# Patient Record
Sex: Female | Born: 1965 | Race: White | Hispanic: No | Smoking: Never smoker
Health system: Southern US, Community
[De-identification: ages and names within clinical notes are randomized; demographics above are authoritative.]

## PROBLEM LIST (undated history)

## (undated) DIAGNOSIS — B999 Unspecified infectious disease: Secondary | ICD-10-CM

## (undated) DIAGNOSIS — D649 Anemia, unspecified: Secondary | ICD-10-CM

## (undated) DIAGNOSIS — K219 Gastro-esophageal reflux disease without esophagitis: Secondary | ICD-10-CM

## (undated) DIAGNOSIS — R51 Headache: Secondary | ICD-10-CM

## (undated) HISTORY — PX: OTHER SURGICAL HISTORY: SHX169

---

## 1999-09-06 ENCOUNTER — Other Ambulatory Visit: Admission: RE | Admit: 1999-09-06 | Discharge: 1999-09-06 | Payer: Self-pay | Admitting: Obstetrics & Gynecology

## 1999-11-27 ENCOUNTER — Inpatient Hospital Stay (HOSPITAL_COMMUNITY): Admission: AD | Admit: 1999-11-27 | Discharge: 1999-11-27 | Payer: Self-pay | Admitting: Obstetrics and Gynecology

## 1999-11-27 ENCOUNTER — Encounter: Payer: Self-pay | Admitting: Obstetrics and Gynecology

## 2000-02-17 ENCOUNTER — Encounter: Admission: RE | Admit: 2000-02-17 | Discharge: 2000-02-17 | Payer: Self-pay | Admitting: Obstetrics & Gynecology

## 2000-02-17 ENCOUNTER — Encounter: Payer: Self-pay | Admitting: Obstetrics & Gynecology

## 2000-03-16 ENCOUNTER — Inpatient Hospital Stay (HOSPITAL_COMMUNITY): Admission: AD | Admit: 2000-03-16 | Discharge: 2000-03-20 | Payer: Self-pay | Admitting: Obstetrics and Gynecology

## 2000-03-16 ENCOUNTER — Encounter: Payer: Self-pay | Admitting: Obstetrics and Gynecology

## 2000-03-21 ENCOUNTER — Encounter: Admission: RE | Admit: 2000-03-21 | Discharge: 2000-04-20 | Payer: Self-pay | Admitting: Obstetrics & Gynecology

## 2000-05-21 ENCOUNTER — Encounter: Admission: RE | Admit: 2000-05-21 | Discharge: 2000-06-20 | Payer: Self-pay | Admitting: Obstetrics & Gynecology

## 2000-06-21 ENCOUNTER — Encounter: Admission: RE | Admit: 2000-06-21 | Discharge: 2000-07-21 | Payer: Self-pay | Admitting: Obstetrics & Gynecology

## 2000-08-21 ENCOUNTER — Encounter: Admission: RE | Admit: 2000-08-21 | Discharge: 2000-09-20 | Payer: Self-pay | Admitting: Obstetrics & Gynecology

## 2000-09-21 ENCOUNTER — Encounter: Admission: RE | Admit: 2000-09-21 | Discharge: 2000-10-21 | Payer: Self-pay | Admitting: Obstetrics & Gynecology

## 2000-11-21 ENCOUNTER — Encounter: Admission: RE | Admit: 2000-11-21 | Discharge: 2000-12-21 | Payer: Self-pay | Admitting: Obstetrics & Gynecology

## 2001-01-16 HISTORY — PX: TOE SURGERY: SHX1073

## 2001-01-21 ENCOUNTER — Encounter: Admission: RE | Admit: 2001-01-21 | Discharge: 2001-02-20 | Payer: Self-pay | Admitting: Obstetrics & Gynecology

## 2001-02-21 ENCOUNTER — Encounter: Admission: RE | Admit: 2001-02-21 | Discharge: 2001-03-23 | Payer: Self-pay | Admitting: Obstetrics & Gynecology

## 2001-04-21 ENCOUNTER — Encounter: Admission: RE | Admit: 2001-04-21 | Discharge: 2001-05-21 | Payer: Self-pay | Admitting: Obstetrics & Gynecology

## 2001-06-21 ENCOUNTER — Encounter: Admission: RE | Admit: 2001-06-21 | Discharge: 2001-07-21 | Payer: Self-pay | Admitting: Obstetrics & Gynecology

## 2001-08-21 ENCOUNTER — Encounter: Admission: RE | Admit: 2001-08-21 | Discharge: 2001-09-20 | Payer: Self-pay | Admitting: Obstetrics & Gynecology

## 2001-09-21 ENCOUNTER — Encounter: Admission: RE | Admit: 2001-09-21 | Discharge: 2001-10-21 | Payer: Self-pay | Admitting: Obstetrics & Gynecology

## 2003-10-14 ENCOUNTER — Emergency Department (HOSPITAL_COMMUNITY): Admission: EM | Admit: 2003-10-14 | Discharge: 2003-10-15 | Payer: Self-pay | Admitting: Emergency Medicine

## 2003-10-21 ENCOUNTER — Emergency Department (HOSPITAL_COMMUNITY): Admission: EM | Admit: 2003-10-21 | Discharge: 2003-10-22 | Payer: Self-pay | Admitting: Emergency Medicine

## 2011-01-03 DIAGNOSIS — K8020 Calculus of gallbladder without cholecystitis without obstruction: Secondary | ICD-10-CM | POA: Insufficient documentation

## 2011-01-08 ENCOUNTER — Emergency Department (HOSPITAL_COMMUNITY)
Admission: EM | Admit: 2011-01-08 | Discharge: 2011-01-08 | Disposition: A | Discharge status: Home / Self Care | Payer: BC Managed Care – PPO | Attending: Emergency Medicine | Admitting: Emergency Medicine

## 2011-01-08 ENCOUNTER — Emergency Department (HOSPITAL_COMMUNITY): Payer: BC Managed Care – PPO

## 2011-01-08 DIAGNOSIS — K8020 Calculus of gallbladder without cholecystitis without obstruction: Secondary | ICD-10-CM | POA: Insufficient documentation

## 2011-01-08 DIAGNOSIS — R51 Headache: Secondary | ICD-10-CM | POA: Insufficient documentation

## 2011-01-08 DIAGNOSIS — R109 Unspecified abdominal pain: Secondary | ICD-10-CM | POA: Insufficient documentation

## 2011-01-08 DIAGNOSIS — Z7982 Long term (current) use of aspirin: Secondary | ICD-10-CM | POA: Insufficient documentation

## 2011-01-08 DIAGNOSIS — N898 Other specified noninflammatory disorders of vagina: Secondary | ICD-10-CM | POA: Insufficient documentation

## 2011-01-08 DIAGNOSIS — N939 Abnormal uterine and vaginal bleeding, unspecified: Secondary | ICD-10-CM

## 2011-01-08 DIAGNOSIS — R110 Nausea: Secondary | ICD-10-CM | POA: Insufficient documentation

## 2011-01-08 LAB — URINALYSIS, ROUTINE W REFLEX MICROSCOPIC
Bilirubin Urine: NEGATIVE
Glucose, UA: NEGATIVE mg/dL
Ketones, ur: NEGATIVE mg/dL
Leukocytes, UA: NEGATIVE
Nitrite: NEGATIVE
Protein, ur: NEGATIVE mg/dL
Specific Gravity, Urine: 1.024 (ref 1.005–1.030)
Urobilinogen, UA: 1 mg/dL (ref 0.0–1.0)
pH: 7 (ref 5.0–8.0)

## 2011-01-08 LAB — URINE MICROSCOPIC-ADD ON

## 2011-01-08 LAB — POCT PREGNANCY, URINE: Preg Test, Ur: NEGATIVE

## 2011-01-08 MED ORDER — HYDROMORPHONE HCL PF 2 MG/ML IJ SOLN
INTRAMUSCULAR | Status: AC
  Filled 2011-01-08: qty 1

## 2011-01-08 MED ORDER — HYDROMORPHONE HCL PF 2 MG/ML IJ SOLN
2.0000 mg | Freq: Once | INTRAMUSCULAR | Status: AC
  Administered 2011-01-08: 2 mg via INTRAMUSCULAR

## 2011-01-08 MED ORDER — ONDANSETRON HCL 4 MG/2ML IJ SOLN
4.0000 mg | Freq: Once | INTRAMUSCULAR | Status: AC
  Administered 2011-01-08: 4 mg via INTRAVENOUS
  Filled 2011-01-08: qty 2

## 2011-01-08 MED ORDER — SODIUM CHLORIDE 0.9 % IV BOLUS (SEPSIS)
500.0000 mL | Freq: Once | INTRAVENOUS | Status: AC
  Administered 2011-01-08: 500 mL via INTRAVENOUS

## 2011-01-08 MED ORDER — HYDROMORPHONE HCL PF 2 MG/ML IJ SOLN: 2.0000 mg | Freq: Once | INTRAMUSCULAR | Status: DC

## 2011-01-08 MED ORDER — OXYCODONE-ACETAMINOPHEN 5-325 MG PO TABS: 1.0000 | ORAL_TABLET | ORAL | Status: AC | PRN

## 2011-01-08 NOTE — ED Provider Notes (Signed)
History     CSN: [id number]  Arrival date & time 01/08/11  7829   First MD Initiated Contact with Patient 01/08/11 0455      Chief Complaint  Patient presents with  . Flank Pain    right side     Patient is a 45 y.o. female presenting with flank pain. The history is provided by the patient.  Flank Pain This is a new problem. The current episode started 3 to 5 hours ago. The problem occurs constantly. The problem has been gradually worsening. Associated symptoms include abdominal pain and headaches. Pertinent negatives include no chest pain and no shortness of breath. The symptoms are aggravated by nothing. The symptoms are relieved by nothing.  pt reports onset of right flank pain with nausea earlier tonight Flank pain is sharp No focal leg weakness No cp/sob No dysuria She does report recent postcoital vag bleeding but has not seen a gynecologist   PMH - none  History reviewed. No pertinent past surgical history.  History reviewed. No pertinent family history.  History  Substance Use Topics  . Smoking status: Never Smoker   . Smokeless tobacco: Not on file  . Alcohol Use: No    OB History    Grav Para Term Preterm Abortions TAB SAB Ect Mult Living                  Review of Systems  Respiratory: Negative for shortness of breath.   Cardiovascular: Negative for chest pain.  Gastrointestinal: Positive for abdominal pain.  Genitourinary: Positive for flank pain.  Neurological: Positive for headaches.  All other systems reviewed and are negative.    Allergies  Review of patient's allergies indicates no known allergies.  Home Medications   Current Outpatient Rx  Name Route Sig Dispense Refill  . ACETAMINOPHEN 500 MG PO TABS Oral Take 1,000 mg by mouth every 6 (six) hours as needed. For pain     . ASPIRIN 325 MG PO TABS Oral Take 325 mg by mouth once.      Marland Kitchen GAS RELIEF 80 PO Oral Take 2 tablets by mouth once.        BP 142/99  Pulse 74  Temp(Src) 97.5  F (36.4 C) (Oral)  Resp 24  Ht 5\' 7"  (1.702 m)  Wt 250 lb (113.399 kg)  BMI 39.16 kg/m2  SpO2 100%  LMP 12/25/2010  Physical Exam CONSTITUTIONAL: Well developed/well nourished HEAD AND FACE: Normocephalic/atraumatic EYES: EOMI/PERRL ENMT: Mucous membranes moist NECK: supple no meningeal signs SPINE:entire spine nontender CV: S1/S2 noted, no murmurs/rubs/gallops noted LUNGS: Lungs are clear to auscultation bilaterally, no apparent distress ABDOMEN: soft, nontender, no rebound or guarding, no RUQ tenderness FA:OZHYQ cva tenderness No obvious cervical mass noted on limited speculum exam. No CMT.  Small amt of vag bleeding Chaperone present NEURO: Pt is awake/alert, moves all extremitiesx4 EXTREMITIES: pulses normal, full ROM SKIN: warm, color normal PSYCH: no abnormalities of mood noted   ED Course  Procedures   Labs Reviewed  URINALYSIS, ROUTINE W REFLEX MICROSCOPIC - Abnormal; Notable for the following:    Hgb urine dipstick LARGE (*)    All other components within normal limits  URINE MICROSCOPIC-ADD ON - Abnormal; Notable for the following:    Squamous Epithelial / LPF FEW (*)    Bacteria, UA FEW (*)    All other components within normal limits  POCT PREGNANCY, URINE  POCT PREGNANCY, URINE   6:22 AM Pt agreeable to have CT imaging to eval for renal  colic  7:54 AM Discussed ct findings and need for f/u with surgery for cholelithiasis - her abd/flank pain is improved, doubt cholecystitis Also need f/u for possible cervical mass - I spoke to dr rivard with GYN and will have outpatient f/u arranged soon for eval for possible cervical cancer.  Pt agreeable to this and information given to patient    MDM  Nursing notes reviewed and considered in documentation All labs/vitals reviewed and considered         Joya Gaskins, MD 01/08/11 [phone]

## 2011-01-08 NOTE — ED Notes (Signed)
Patient is resting comfortably. 

## 2011-01-08 NOTE — ED Notes (Signed)
Received pt. From triage, pt. Ambulatory, gait steady, NAD noted,

## 2011-01-08 NOTE — ED Notes (Signed)
Pt c/o sharp right sided flank pain

## 2011-01-12 ENCOUNTER — Other Ambulatory Visit: Payer: Self-pay | Admitting: Obstetrics and Gynecology

## 2011-01-12 DIAGNOSIS — Z1231 Encounter for screening mammogram for malignant neoplasm of breast: Secondary | ICD-10-CM

## 2011-01-25 ENCOUNTER — Ambulatory Visit
Admission: RE | Admit: 2011-01-25 | Discharge: 2011-01-25 | Disposition: A | Discharge status: Home / Self Care | Payer: BC Managed Care – PPO | Source: Ambulatory Visit | Attending: Obstetrics and Gynecology | Admitting: Obstetrics and Gynecology

## 2011-01-25 DIAGNOSIS — Z1231 Encounter for screening mammogram for malignant neoplasm of breast: Secondary | ICD-10-CM

## 2011-02-03 ENCOUNTER — Ambulatory Visit (INDEPENDENT_AMBULATORY_CARE_PROVIDER_SITE_OTHER): Payer: BC Managed Care – PPO | Admitting: Surgery

## 2011-02-03 ENCOUNTER — Encounter (INDEPENDENT_AMBULATORY_CARE_PROVIDER_SITE_OTHER): Payer: Self-pay | Admitting: Surgery

## 2011-02-03 VITALS — BP 128/82 | HR 80 | Resp 16 | Ht 67.0 in | Wt 231.0 lb

## 2011-02-03 DIAGNOSIS — K8020 Calculus of gallbladder without cholecystitis without obstruction: Secondary | ICD-10-CM

## 2011-02-03 NOTE — Patient Instructions (Signed)
We will schedule surgery to remove your gallbladder 

## 2011-02-03 NOTE — Progress Notes (Signed)
Addended by: Currie Paris on: 02/03/2011 11:04 AM   Modules accepted: Orders

## 2011-02-03 NOTE — Progress Notes (Signed)
Patient ID: Tamara Chandler, female   DOB: [birth year], 46 y.o.   MRN: [id number]  Chief Complaint  Patient presents with  . Cholelithiasis    HPI Tamara Chandler is a 46 y.o. female.   She was seen in the emergency room with what was described by the emergency room physician his right flank pain. They appear that she's having a kidney stone and obtained a CAT scan. Instead, she had gallstones. The however there was no evidence of acute cholecystitis. There is no evidence for kidney stone either. Her pain resolved and she was released from the emergency department.  She notes she has intermittent right-sided discomfort. She points to the right upper quadrant where she was having pain when she was in the emergency department. She is more discomfort when she greasy or fried foods. She has not had any more severe episode that she had that took her to the emergency department. She was asked to follow up with a surgeon because of the gallstones. She is asymptomatic today. HPI  History reviewed. No pertinent past medical history.  Past Surgical History  Procedure Date  . Vaginal deliveries   . Cesarean section     History reviewed. No pertinent family history.  Social History History  Substance Use Topics  . Smoking status: Never Smoker   . Smokeless tobacco: Not on file  . Alcohol Use: No    No Known Allergies  Current Outpatient Prescriptions  Medication Sig Dispense Refill  . acetaminophen (TYLENOL) 500 MG tablet Take 1,000 mg by mouth every 6 (six) hours as needed. For pain       . aspirin 325 MG tablet Take 325 mg by mouth once.        . Simethicone (GAS RELIEF 80 PO) Take 2 tablets by mouth once.          Review of Systems Review of Systems  Constitutional: Negative for fever, chills and unexpected weight change.  HENT: Positive for congestion. Negative for hearing loss, sore throat, trouble swallowing and voice change.   Eyes: Negative for visual disturbance.    Respiratory: Negative for cough and wheezing.   Cardiovascular: Negative for chest pain, palpitations and leg swelling.  Gastrointestinal: Negative for nausea, vomiting, abdominal pain, diarrhea, constipation, blood in stool, abdominal distention and anal bleeding.  Genitourinary: Negative for hematuria, vaginal bleeding and difficulty urinating.  Musculoskeletal: Negative for arthralgias.  Skin: Negative for rash and wound.  Neurological: Negative for seizures, syncope and headaches.  Hematological: Negative for adenopathy. Does not bruise/bleed easily.  Psychiatric/Behavioral: Negative for confusion.    Blood pressure 128/82, pulse 80, resp. rate 16, height 5\' 7"  (1.702 m), weight 231 lb (104.781 kg), last menstrual period 01/23/2011.  Physical Exam Physical Exam  Vitals reviewed. Constitutional: She is oriented to person, place, and time. She appears well-developed and well-nourished. No distress.  HENT:  Head: Normocephalic and atraumatic.  Mouth/Throat: Oropharynx is clear and moist.  Eyes: Conjunctivae and EOM are normal. Pupils are equal, round, and reactive to light. No scleral icterus.  Neck: Normal range of motion. Neck supple. No tracheal deviation present. No thyromegaly present.  Cardiovascular: Normal rate, regular rhythm, normal heart sounds and intact distal pulses.  Exam reveals no gallop and no friction rub.   No murmur heard. Pulmonary/Chest: Effort normal and breath sounds normal. No respiratory distress. She has no wheezes. She has no rales.  Abdominal: Soft. Bowel sounds are normal. She exhibits no distension and no mass. There is no tenderness. There  is no rebound and no guarding.  Musculoskeletal: Normal range of motion. She exhibits no edema and no tenderness.  Neurological: She is alert and oriented to person, place, and time.  Skin: Skin is warm and dry. No rash noted. She is not diaphoretic. No erythema.  Psychiatric: She has a normal mood and affect. Her  behavior is normal. Judgment and thought content normal.    Data Reviewed Reviewed Epic notes from ED visit and CT report  Assessment    Symptomatic gallstones    Plan    Recommend LC&IOC. I have discussed the indications for laparoscopic cholecystectomy with her and provided educational material. We have discussed the risks of surgery, including general risks such as bleeding, infection, lung and heart issues etc. We have also discussed the potential for injuries to other organs, bile duct leaks, and other unexpected events. We have also talked about the fact that this may need to be converted to open under certain circumstances. We discussed the typical post op recovery and the fact that there is a good likelihood of improvement in symptoms and return to normal activity.  She understands this and wishes to proceed to schedule surgery. I believe all of .his questions have been answered.  She had an abnormality of the cervix noted on CT, but has been seen by Dr Laurel Dimmer and exam was normal (per patient).          Cash Duce J 02/03/2011, 10:49 AM

## 2011-02-07 ENCOUNTER — Encounter (HOSPITAL_COMMUNITY): Payer: Self-pay | Admitting: Pharmacy Technician

## 2011-02-15 ENCOUNTER — Encounter (HOSPITAL_COMMUNITY)
Admission: RE | Admit: 2011-02-15 | Discharge: 2011-02-15 | Disposition: A | Discharge status: Home / Self Care | Payer: BC Managed Care – PPO | Source: Ambulatory Visit | Attending: Surgery | Admitting: Surgery

## 2011-02-15 ENCOUNTER — Encounter (HOSPITAL_COMMUNITY): Payer: Self-pay

## 2011-02-15 ENCOUNTER — Telehealth (INDEPENDENT_AMBULATORY_CARE_PROVIDER_SITE_OTHER): Payer: Self-pay | Admitting: General Surgery

## 2011-02-15 DIAGNOSIS — K8020 Calculus of gallbladder without cholecystitis without obstruction: Secondary | ICD-10-CM | POA: Insufficient documentation

## 2011-02-15 DIAGNOSIS — Z01812 Encounter for preprocedural laboratory examination: Secondary | ICD-10-CM | POA: Insufficient documentation

## 2011-02-15 HISTORY — DX: Headache: R51

## 2011-02-15 HISTORY — DX: Gastro-esophageal reflux disease without esophagitis: K21.9

## 2011-02-15 LAB — URINALYSIS, ROUTINE W REFLEX MICROSCOPIC
Nitrite: NEGATIVE
Protein, ur: 30 mg/dL — AB
Urobilinogen, UA: 1 mg/dL (ref 0.0–1.0)

## 2011-02-15 LAB — COMPREHENSIVE METABOLIC PANEL
AST: 12 U/L (ref 0–37)
Albumin: 3.3 g/dL — ABNORMAL LOW (ref 3.5–5.2)
BUN: 14 mg/dL (ref 6–23)
Creatinine, Ser: 0.84 mg/dL (ref 0.50–1.10)
Total Protein: 6.9 g/dL (ref 6.0–8.3)

## 2011-02-15 LAB — DIFFERENTIAL
Lymphocytes Relative: 29 % (ref 12–46)
Lymphs Abs: 2.6 10*3/uL (ref 0.7–4.0)
Monocytes Absolute: 0.8 10*3/uL (ref 0.1–1.0)
Monocytes Relative: 8 % (ref 3–12)
Neutro Abs: 5.5 10*3/uL (ref 1.7–7.7)
Neutrophils Relative %: 59 % (ref 43–77)

## 2011-02-15 LAB — HCG, SERUM, QUALITATIVE: Preg, Serum: NEGATIVE

## 2011-02-15 LAB — SURGICAL PCR SCREEN: MRSA, PCR: NEGATIVE

## 2011-02-15 LAB — LIPASE, BLOOD: Lipase: 49 U/L (ref 11–59)

## 2011-02-15 LAB — CBC
HCT: 30.3 % — ABNORMAL LOW (ref 36.0–46.0)
MCHC: 29.7 g/dL — ABNORMAL LOW (ref 30.0–36.0)
MCV: 67.5 fL — ABNORMAL LOW (ref 78.0–100.0)
Platelets: 369 10*3/uL (ref 150–400)
RDW: 17.9 % — ABNORMAL HIGH (ref 11.5–15.5)
WBC: 9.2 10*3/uL (ref 4.0–10.5)

## 2011-02-15 LAB — URINE MICROSCOPIC-ADD ON

## 2011-02-15 NOTE — Patient Instructions (Signed)
20 Tamara Chandler [facility]  02/15/2011   Your procedure is scheduled on:  Tuesday 02/21/2011 at 1:20 pm  Report to [facility] at 1120 AM.  Call this number if you have problems the morning of surgery: [phone]   Remember:   Do not eat food:After Midnight.  May have clear liquids: up to 4 Hours before arrival.( up until 0720 am 02/21/2011)  Clear liquids include soda, tea, black coffee, apple or grape juice, broth.  Take these medicines the morning of surgery with A SIP OF WATER: none   Do not wear jewelry, make-up or nail polish.  Do not wear lotions, powders, or perfumes.   Do not shave 48 hours prior to surgery.(women only- shaving legs)  Do not bring valuables to the hospital.  Contacts, dentures or bridgework may not be worn into surgery.  Leave suitcase in the car. After surgery it may be brought to your room.  For patients admitted to the hospital, checkout time is 11:00 AM the day of discharge.   Patients discharged the day of surgery will not be allowed to drive home.  Name and phone number of your driver:  Special Instructions: CHG Shower Use Special Wash: 1/2 bottle night before surgery and 1/2 bottle morning of surgery.   Please read over the following fact sheets that you were given: MRSA Information

## 2011-02-15 NOTE — Pre-Procedure Instructions (Signed)
Talked to Shyrl Numbers at Dr. Tenna Child office to inform her to let him know of abnormal lab results-Jade stated that Dr. Jamey Ripa was aware and working on abnormal labs.

## 2011-02-15 NOTE — Telephone Encounter (Signed)
Called and spoke with patient re: low hgb. Patient states she does not have a history of anemia. She is on her menstral period. She does have lab work drawn routinely by Dr Pennie Rushing. She does not have a primary care doctor. I called Dr Pennie Rushing to request results of her last few labs she has had drawn.

## 2011-02-15 NOTE — Telephone Encounter (Signed)
Message copied by Liliana Cline on Wed Feb 15, 2011  3:34 PM ------      Message from: Currie Paris      Created: Wed Feb 15, 2011  2:47 PM       This patient has low hemoglobin - can you find out if she has a primary care - we will need to find out why her hgb is low of if that is chronic

## 2011-02-16 NOTE — Telephone Encounter (Signed)
I called Dr Lilian Coma office, but only labs they have are a CMET. They faxed results. I called patient and made her aware we will need to get her in with a PCP to figure out why her hemoglobin is low. I set her up with Dr Nehemiah Settle on Monday 02/20/11 @ 2:15 pm. Called patient back and she did not answer. Left message for her to call me.

## 2011-02-16 NOTE — Telephone Encounter (Signed)
Patient made aware of her PCP appt and need to cancel surgery until her hgb normalizes. She is frustrated but understands. She will call back to our scheduling department once her hgb is normal.

## 2011-02-16 NOTE — Telephone Encounter (Signed)
I got a message to Dr Jamey Ripa asking about cancelling surgery, and he states we do need to cancel until we get patient's hemoglobin stable. I am still awaiting patient's call back.

## 2011-02-21 ENCOUNTER — Encounter (HOSPITAL_COMMUNITY): Admission: RE | Payer: Self-pay | Source: Ambulatory Visit

## 2011-02-21 ENCOUNTER — Ambulatory Visit (HOSPITAL_COMMUNITY): Admission: RE | Admit: 2011-02-21 | Payer: BC Managed Care – PPO | Source: Ambulatory Visit | Admitting: Surgery

## 2011-02-21 SURGERY — LAPAROSCOPIC CHOLECYSTECTOMY WITH INTRAOPERATIVE CHOLANGIOGRAM
Anesthesia: General

## 2011-02-23 ENCOUNTER — Encounter (INDEPENDENT_AMBULATORY_CARE_PROVIDER_SITE_OTHER): Payer: Self-pay

## 2011-03-08 ENCOUNTER — Encounter (INDEPENDENT_AMBULATORY_CARE_PROVIDER_SITE_OTHER): Payer: BC Managed Care – PPO | Admitting: Surgery

## 2011-03-17 ENCOUNTER — Telehealth (INDEPENDENT_AMBULATORY_CARE_PROVIDER_SITE_OTHER): Payer: Self-pay | Admitting: General Surgery

## 2011-03-17 NOTE — Telephone Encounter (Signed)
Message copied by Liliana Cline on Fri Mar 17, 2011  9:54 AM ------      Message from: [place], [place]      Created: Wed Mar 08, 2011  4:02 PM       Check on patient's hemoglobin

## 2011-03-17 NOTE — Telephone Encounter (Signed)
Called patient to check on the status of her workup for her low hemoglobin. Left message for her to call me back.

## 2011-03-20 NOTE — Telephone Encounter (Signed)
Spoke with patient. She has a follow up with her primary MD this week to check her hemoglobin after starting iron. She will call us after to get scheduled for lap chole.

## 2011-03-24 ENCOUNTER — Encounter (INDEPENDENT_AMBULATORY_CARE_PROVIDER_SITE_OTHER): Payer: Self-pay | Admitting: Surgery

## 2011-04-20 ENCOUNTER — Telehealth (INDEPENDENT_AMBULATORY_CARE_PROVIDER_SITE_OTHER): Payer: Self-pay

## 2011-04-20 ENCOUNTER — Encounter (HOSPITAL_COMMUNITY): Payer: Self-pay | Admitting: Pharmacy Technician

## 2011-04-20 ENCOUNTER — Other Ambulatory Visit (INDEPENDENT_AMBULATORY_CARE_PROVIDER_SITE_OTHER): Payer: Self-pay | Admitting: Surgery

## 2011-04-20 NOTE — Telephone Encounter (Signed)
Per Darlene at [facility] pre admit need permit put into epic for [employer] .

## 2011-04-21 ENCOUNTER — Encounter (HOSPITAL_COMMUNITY)
Admission: RE | Admit: 2011-04-21 | Discharge: 2011-04-21 | Disposition: A | Discharge status: Home / Self Care | Payer: BC Managed Care – PPO | Source: Ambulatory Visit | Attending: Surgery | Admitting: Surgery

## 2011-04-21 ENCOUNTER — Telehealth (INDEPENDENT_AMBULATORY_CARE_PROVIDER_SITE_OTHER): Payer: Self-pay

## 2011-04-21 ENCOUNTER — Encounter (HOSPITAL_COMMUNITY): Payer: Self-pay

## 2011-04-21 ENCOUNTER — Telehealth (INDEPENDENT_AMBULATORY_CARE_PROVIDER_SITE_OTHER): Payer: Self-pay | Admitting: General Surgery

## 2011-04-21 HISTORY — DX: Anemia, unspecified: D64.9

## 2011-04-21 LAB — CBC
HCT: 38 % (ref 36.0–46.0)
RBC: 5.24 MIL/uL — ABNORMAL HIGH (ref 3.87–5.11)
RDW: 20.3 % — ABNORMAL HIGH (ref 11.5–15.5)
WBC: 11.4 10*3/uL — ABNORMAL HIGH (ref 4.0–10.5)

## 2011-04-21 LAB — SURGICAL PCR SCREEN: Staphylococcus aureus: NEGATIVE

## 2011-04-21 NOTE — Patient Instructions (Signed)
20 Tamara Chandler [facility]  04/21/2011   Your procedure is scheduled on:  Wednesday 04/26/2011 at 0830am  Report to [facility] at 0630 AM.  Call this number if you have problems the morning of surgery: [phone]   Remember:   Do not eat food:After Midnight.  May have clear liquids:.til midnight  Take these medicines the morning of surgery with A SIP OF WATER: Percocet if needed   Do not wear jewelry, make-up or nail polish.  Do not wear lotions, powders, or perfumes.   Do not shave 48 hours prior to surgery.(women only-shaving legs)  Do not bring valuables to the hospital.  Contacts, dentures or bridgework may not be worn into surgery.  Leave suitcase in the car. After surgery it may be brought to your room.  For patients admitted to the hospital, checkout time is 11:00 AM the day of discharge  Special Instructions: CHG Shower Use Special Wash: 1/2 bottle night before surgery and 1/2 bottle morning of surgery.   Please read over the following fact sheets that you were given: MRSA Information,incentive spirometry sheet, sleep apnea sheet

## 2011-04-21 NOTE — Telephone Encounter (Signed)
Tamara Chandler called to report the patient does have an abnormal cbc.  She has had surgery cancelled before for anemia.  Her surgery is 4/10.  She is going to the dentist today to rule out an abscess.

## 2011-04-21 NOTE — Pre-Procedure Instructions (Addendum)
Pt. Comes in for pre-op visit c/o toothache-this surgery was cancelled last month pt. states  due to low hgb . Called pt's Dentist-partner Dr. Wynelle Link will see her today at 3pm-informed pt. to have Dentist talk to someone at [facility] if pt. Needs abx treatment or more for problem today.Talked with Zadie Rhine this am and informed her of this problem. Had talked to Dr. Acey Lav ,Anesthesia who recommended pt. see a Dentist today.

## 2011-04-21 NOTE — Telephone Encounter (Signed)
Pt calling from dentist.  After discussion with dentist and [facility], pt has opted to proceed with dental extraction and have her surgery as scheduled next Wednesday.

## 2011-04-21 NOTE — Telephone Encounter (Signed)
Tamara Chandler, at [facility] pre-admission, calling for two concerns.  Pt is scheduled with  for GB surgery on 04/26/11.  First, today pt has toothache today and is scheduled to be seen by her DDS at 2:00 (Dr. Sheria Lang and Dr. Wynelle Link at [phone].  Second, she needs refill on pain meds, as her other prescription is almost used up.  [name] calling to give "heads up" in case the DDS calls Dr. Jamey Ripa today.

## 2011-04-21 NOTE — Pre-Procedure Instructions (Signed)
Informed Tamara Chandler of pt. Having abnormal CBC results-will inform Doctor on call for Dr. Jamey Ripa to view.

## 2011-04-21 NOTE — Telephone Encounter (Signed)
Per Dr Jamey Ripa to proceed with surgery unless he hears something otherwise from the dentist.

## 2011-04-26 ENCOUNTER — Ambulatory Visit (HOSPITAL_COMMUNITY): Payer: BC Managed Care – PPO | Admitting: *Deleted

## 2011-04-26 ENCOUNTER — Ambulatory Visit (HOSPITAL_COMMUNITY)
Admission: RE | Admit: 2011-04-26 | Discharge: 2011-04-27 | Disposition: A | Discharge status: Home / Self Care | Payer: BC Managed Care – PPO | Source: Ambulatory Visit | Attending: Surgery | Admitting: Surgery

## 2011-04-26 ENCOUNTER — Encounter (HOSPITAL_COMMUNITY): Payer: Self-pay | Admitting: *Deleted

## 2011-04-26 ENCOUNTER — Ambulatory Visit (HOSPITAL_COMMUNITY): Payer: BC Managed Care – PPO

## 2011-04-26 ENCOUNTER — Encounter (HOSPITAL_COMMUNITY)
Admission: RE | Disposition: A | Discharge status: Home / Self Care | Payer: Self-pay | Source: Ambulatory Visit | Attending: Surgery

## 2011-04-26 DIAGNOSIS — Z01812 Encounter for preprocedural laboratory examination: Secondary | ICD-10-CM | POA: Insufficient documentation

## 2011-04-26 DIAGNOSIS — K8020 Calculus of gallbladder without cholecystitis without obstruction: Secondary | ICD-10-CM

## 2011-04-26 DIAGNOSIS — K8010 Calculus of gallbladder with chronic cholecystitis without obstruction: Secondary | ICD-10-CM

## 2011-04-26 DIAGNOSIS — K219 Gastro-esophageal reflux disease without esophagitis: Secondary | ICD-10-CM | POA: Insufficient documentation

## 2011-04-26 HISTORY — PX: CHOLECYSTECTOMY: SHX55

## 2011-04-26 SURGERY — LAPAROSCOPIC CHOLECYSTECTOMY WITH INTRAOPERATIVE CHOLANGIOGRAM
Anesthesia: General | Site: Abdomen | Wound class: Contaminated

## 2011-04-26 MED ORDER — IOHEXOL 300 MG/ML  SOLN
INTRAMUSCULAR | Status: DC | PRN
  Administered 2011-04-26: 6 mL via INTRAVENOUS

## 2011-04-26 MED ORDER — ONDANSETRON HCL 4 MG/2ML IJ SOLN: 4.0000 mg | Freq: Four times a day (QID) | INTRAMUSCULAR | Status: DC | PRN

## 2011-04-26 MED ORDER — CISATRACURIUM BESYLATE 2 MG/ML IV SOLN
INTRAVENOUS | Status: DC | PRN
  Administered 2011-04-26: 2 mg via INTRAVENOUS
  Administered 2011-04-26: 6 mg via INTRAVENOUS
  Administered 2011-04-26: 2 mg via INTRAVENOUS

## 2011-04-26 MED ORDER — HYDROMORPHONE HCL PF 1 MG/ML IJ SOLN
0.2500 mg | INTRAMUSCULAR | Status: DC | PRN
  Administered 2011-04-26 (×4): 0.5 mg via INTRAVENOUS

## 2011-04-26 MED ORDER — FENTANYL CITRATE 0.05 MG/ML IJ SOLN
INTRAMUSCULAR | Status: DC | PRN
  Administered 2011-04-26: 100 ug via INTRAVENOUS
  Administered 2011-04-26 (×4): 50 ug via INTRAVENOUS

## 2011-04-26 MED ORDER — HYDROMORPHONE HCL PF 1 MG/ML IJ SOLN: 2.0000 mg | INTRAMUSCULAR | Status: DC | PRN

## 2011-04-26 MED ORDER — LACTATED RINGERS IV SOLN: INTRAVENOUS | Status: DC

## 2011-04-26 MED ORDER — LIDOCAINE HCL (CARDIAC) 20 MG/ML IV SOLN
INTRAVENOUS | Status: DC | PRN
  Administered 2011-04-26: 75 mg via INTRAVENOUS

## 2011-04-26 MED ORDER — GLYCOPYRROLATE 0.2 MG/ML IJ SOLN
INTRAMUSCULAR | Status: DC | PRN
  Administered 2011-04-26: 0.4 mg via INTRAVENOUS

## 2011-04-26 MED ORDER — MEPERIDINE HCL 50 MG/ML IJ SOLN: 6.2500 mg | INTRAMUSCULAR | Status: DC | PRN

## 2011-04-26 MED ORDER — NEOSTIGMINE METHYLSULFATE 1 MG/ML IJ SOLN
INTRAMUSCULAR | Status: DC | PRN
  Administered 2011-04-26: 2 mg via INTRAVENOUS

## 2011-04-26 MED ORDER — BUPIVACAINE HCL (PF) 0.25 % IJ SOLN
INTRAMUSCULAR | Status: AC
  Filled 2011-04-26: qty 30

## 2011-04-26 MED ORDER — HYDROMORPHONE HCL 2 MG PO TABS: 2.0000 mg | ORAL_TABLET | ORAL | Status: DC | PRN

## 2011-04-26 MED ORDER — PROPOFOL 10 MG/ML IV EMUL
INTRAVENOUS | Status: DC | PRN
  Administered 2011-04-26: 175 mg via INTRAVENOUS

## 2011-04-26 MED ORDER — EPHEDRINE SULFATE 50 MG/ML IJ SOLN
INTRAMUSCULAR | Status: DC | PRN
  Administered 2011-04-26: 10 mg via INTRAVENOUS

## 2011-04-26 MED ORDER — METOCLOPRAMIDE HCL 5 MG/ML IJ SOLN
INTRAMUSCULAR | Status: DC | PRN
  Administered 2011-04-26: 5 mg via INTRAVENOUS

## 2011-04-26 MED ORDER — ONDANSETRON HCL 4 MG PO TABS: 4.0000 mg | ORAL_TABLET | Freq: Four times a day (QID) | ORAL | Status: DC | PRN

## 2011-04-26 MED ORDER — ACETAMINOPHEN 10 MG/ML IV SOLN
INTRAVENOUS | Status: AC
  Filled 2011-04-26: qty 100

## 2011-04-26 MED ORDER — LACTATED RINGERS IR SOLN
Status: DC | PRN
  Administered 2011-04-26: 1000 mL

## 2011-04-26 MED ORDER — ACETAMINOPHEN 10 MG/ML IV SOLN
INTRAVENOUS | Status: DC | PRN
  Administered 2011-04-26: 1000 mg via INTRAVENOUS

## 2011-04-26 MED ORDER — IOHEXOL 300 MG/ML  SOLN
INTRAMUSCULAR | Status: AC
  Filled 2011-04-26: qty 1

## 2011-04-26 MED ORDER — HYDROMORPHONE HCL PF 1 MG/ML IJ SOLN
INTRAMUSCULAR | Status: AC
  Filled 2011-04-26: qty 1

## 2011-04-26 MED ORDER — HEPARIN SODIUM (PORCINE) 5000 UNIT/ML IJ SOLN
5000.0000 [IU] | Freq: Three times a day (TID) | INTRAMUSCULAR | Status: DC
  Filled 2011-04-26 (×4): qty 1

## 2011-04-26 MED ORDER — HYDROMORPHONE HCL 2 MG PO TABS
2.0000 mg | ORAL_TABLET | ORAL | Status: DC | PRN
  Administered 2011-04-27 (×3): 2 mg via ORAL
  Filled 2011-04-26 (×3): qty 1

## 2011-04-26 MED ORDER — BUPIVACAINE HCL (PF) 0.25 % IJ SOLN
INTRAMUSCULAR | Status: DC | PRN
  Administered 2011-04-26: 20 mL

## 2011-04-26 MED ORDER — ONDANSETRON HCL 4 MG/2ML IJ SOLN
INTRAMUSCULAR | Status: DC | PRN
  Administered 2011-04-26 (×2): 2 mg via INTRAVENOUS

## 2011-04-26 MED ORDER — SUCCINYLCHOLINE CHLORIDE 20 MG/ML IJ SOLN
INTRAMUSCULAR | Status: DC | PRN
  Administered 2011-04-26: 100 mg via INTRAVENOUS

## 2011-04-26 MED ORDER — DEXAMETHASONE SODIUM PHOSPHATE 10 MG/ML IJ SOLN
INTRAMUSCULAR | Status: DC | PRN
  Administered 2011-04-26: 10 mg via INTRAVENOUS

## 2011-04-26 MED ORDER — HYDROMORPHONE HCL PF 2 MG/ML IJ SOLN
2.0000 mg | INTRAMUSCULAR | Status: DC | PRN
  Administered 2011-04-26: 1 mg via INTRAVENOUS
  Administered 2011-04-26: 2 mg via INTRAVENOUS
  Filled 2011-04-26 (×3): qty 1

## 2011-04-26 MED ORDER — LACTATED RINGERS IV SOLN
INTRAVENOUS | Status: DC | PRN
  Administered 2011-04-26 (×2): via INTRAVENOUS

## 2011-04-26 MED ORDER — PROMETHAZINE HCL 25 MG/ML IJ SOLN: 6.2500 mg | INTRAMUSCULAR | Status: DC | PRN

## 2011-04-26 MED ORDER — CEFAZOLIN SODIUM-DEXTROSE 2-3 GM-% IV SOLR
2.0000 g | INTRAVENOUS | Status: AC
  Administered 2011-04-26: 2 g via INTRAVENOUS
  Filled 2011-04-26: qty 50

## 2011-04-26 MED ORDER — KCL-LACTATED RINGERS-D5W 20 MEQ/L IV SOLN
INTRAVENOUS | Status: DC
  Administered 2011-04-26 – 2011-04-27 (×3): via INTRAVENOUS
  Filled 2011-04-26 (×5): qty 1000

## 2011-04-26 SURGICAL SUPPLY — 46 items
ADH SKN CLS APL DERMABOND .7 (GAUZE/BANDAGES/DRESSINGS) ×1
APPLIER CLIP ROT 10 11.4 M/L (STAPLE) ×2
APR CLP MED LRG 11.4X10 (STAPLE) ×1
BAG SPEC RTRVL LRG 6X4 10 (ENDOMECHANICALS) ×1
CANISTER SUCTION 2500CC (MISCELLANEOUS) ×2 IMPLANT
CATH REDDICK CHOLANGI 4FR 50CM (CATHETERS) IMPLANT
CHLORAPREP W/TINT 10.5 ML (MISCELLANEOUS) ×2 IMPLANT
CLIP APPLIE ROT 10 11.4 M/L (STAPLE) ×1 IMPLANT
CLOTH BEACON ORANGE TIMEOUT ST (SAFETY) ×2 IMPLANT
COVER MAYO STAND STRL (DRAPES) ×2 IMPLANT
DECANTER SPIKE VIAL GLASS SM (MISCELLANEOUS) ×2 IMPLANT
DERMABOND ADVANCED (GAUZE/BANDAGES/DRESSINGS) ×1
DERMABOND ADVANCED .7 DNX12 (GAUZE/BANDAGES/DRESSINGS) ×1 IMPLANT
DRAIN CHANNEL RND F F (WOUND CARE) ×2 IMPLANT
DRAPE C-ARM 42X72 X-RAY (DRAPES) ×2 IMPLANT
DRAPE LAPAROSCOPIC ABDOMINAL (DRAPES) ×2 IMPLANT
ELECT REM PT RETURN 9FT ADLT (ELECTROSURGICAL) ×2
ELECTRODE REM PT RTRN 9FT ADLT (ELECTROSURGICAL) ×1 IMPLANT
EVACUATOR SILICONE 100CC (DRAIN) ×2 IMPLANT
FILTER SMOKE EVAC LAPAROSHD (FILTER) ×2 IMPLANT
GLOVE BIOGEL PI IND STRL 7.0 (GLOVE) ×1 IMPLANT
GLOVE BIOGEL PI INDICATOR 7.0 (GLOVE) ×1
GLOVE EUDERMIC 7 POWDERFREE (GLOVE) ×2 IMPLANT
GOWN STRL NON-REIN LRG LVL3 (GOWN DISPOSABLE) ×2 IMPLANT
GOWN STRL REIN XL XLG (GOWN DISPOSABLE) ×4 IMPLANT
HEMOSTAT SURGICEL 4X8 (HEMOSTASIS) IMPLANT
IV CATH 14GX2 1/4 (CATHETERS) IMPLANT
IV SET EXT 30 76VOL 4 MALE LL (IV SETS) IMPLANT
KIT BASIN OR (CUSTOM PROCEDURE TRAY) ×2 IMPLANT
NS IRRIG 1000ML POUR BTL (IV SOLUTION) ×2 IMPLANT
POUCH SPECIMEN RETRIEVAL 10MM (ENDOMECHANICALS) ×2 IMPLANT
SET CHOLANGIOGRAPH MIX (MISCELLANEOUS) ×2 IMPLANT
SET IRRIG TUBING LAPAROSCOPIC (IRRIGATION / IRRIGATOR) ×2 IMPLANT
SLEEVE Z-THREAD 5X100MM (TROCAR) ×2 IMPLANT
SOLUTION ANTI FOG 6CC (MISCELLANEOUS) ×2 IMPLANT
STOPCOCK K 69 2C6206 (IV SETS) IMPLANT
STRIP CLOSURE SKIN 1/4X3 (GAUZE/BANDAGES/DRESSINGS) ×4 IMPLANT
SUT ETHILON 2 0 PS N (SUTURE) ×2 IMPLANT
SUT MNCRL AB 4-0 PS2 18 (SUTURE) ×2 IMPLANT
SUT VICRYL 0 UR6 27IN ABS (SUTURE) ×2 IMPLANT
TOWEL OR 17X26 10 PK STRL BLUE (TOWEL DISPOSABLE) ×2 IMPLANT
TRAY LAP CHOLE (CUSTOM PROCEDURE TRAY) ×2 IMPLANT
TROCAR XCEL BLUNT TIP 100MML (ENDOMECHANICALS) ×2 IMPLANT
TROCAR Z-THREAD FIOS 11X100 BL (TROCAR) ×2 IMPLANT
TROCAR Z-THREAD FIOS 5X100MM (TROCAR) ×2 IMPLANT
TUBING INSUFFLATION 10FT LAP (TUBING) ×2 IMPLANT

## 2011-04-26 NOTE — Progress Notes (Signed)
Post op Check  Patient comfortable, bu some pain around incisions. No nausea and tolerated liquids PO.  Vital signs: BP 112/72  Pulse 87  Temp(Src) 97.4 F (36.3 C) (Oral)  Resp 18  Ht 5\' 9"  (1.753 m)  Wt 229 lb 4.8 oz (104.01 kg)  BMI 33.86 kg/m2  SpO2 98%  LMP 04/14/2011  Exam: Alert and comfortable. Abdomen soft, drain thin red.  Imp: Stable  Plan: Advance diet as tolerated and likely DC in am

## 2011-04-26 NOTE — Anesthesia Postprocedure Evaluation (Signed)
  Anesthesia Post-op Note  Patient: Tamara Chandler  Procedure(s) Performed: Procedure(s) (LRB): LAPAROSCOPIC CHOLECYSTECTOMY WITH INTRAOPERATIVE CHOLANGIOGRAM (N/A)  Patient Location: PACU  Anesthesia Type: General  Level of Consciousness: awake and alert   Airway and Oxygen Therapy: Patient Spontanous Breathing  Post-op Pain: mild  Post-op Assessment: Post-op Vital signs reviewed, Patient's Cardiovascular Status Stable, Respiratory Function Stable, Patent Airway and No signs of Nausea or vomiting  Post-op Vital Signs: stable  Complications: No apparent anesthesia complications

## 2011-04-26 NOTE — Preoperative (Signed)
Beta Blockers   Reason not to administer Beta Blockers:Not Applicable 

## 2011-04-26 NOTE — Transfer of Care (Signed)
Immediate Anesthesia Transfer of Care Note  Patient: Tamara Chandler Sao  Procedure(s) Performed: Procedure(s) (LRB): LAPAROSCOPIC CHOLECYSTECTOMY WITH INTRAOPERATIVE CHOLANGIOGRAM (N/A)  Patient Location: PACU  Anesthesia Type: General  Level of Consciousness: awake, oriented and patient cooperative  Airway & Oxygen Therapy: Patient Spontanous Breathing and Patient connected to face mask oxygen  Post-op Assessment: Report given to PACU RN, Post -op Vital signs reviewed and stable and Patient moving all extremities  Post vital signs: Reviewed and stable  Complications: No apparent anesthesia complications

## 2011-04-26 NOTE — H&P (Signed)
  NAME: Tamara Chandler DOB: 1966/01/13 MRN: [id number]                                                                                      DATE: 03/30/2011  PCP: Sheila Oats, MD, MD Referring Provider: No ref. provider found  IMPRESSION:  Gallstones with symptoms  PLAN:   Lap Chole & IOC                 CC: No chief complaint on file.   HPI:  Tamara Chandler is a 46 y.o.  female who presents for cholecystectomy. She had an episode of biliary colic and seen in the ED. She was scheduled for surgery but noted to be anemic so surgery postponed. Anemia was secondary to heavy periods  PMH:  has a past medical history of GERD (gastroesophageal reflux disease); Headache; and Anemia.  PSH:   has past surgical history that includes vaginal deliveries; Cesarean section; and Toe Surgery (2003).  ALLERGIES:  No Known Allergies  MEDICATIONS: No current facility-administered medications for this encounter.  ROS: No change from January  EXAM:   GENERAL:  The patient is alert, oriented, and generally healthy-appearing, NAD. Mood and affect are normal.  HEENT:  The head is normocephalic, the eyes nonicteric, the pupils were round regular and equal. EOMs are normal. Pharynx normal. Dentition good.  NECK:  The neck is supple and there are no masses or thyromegaly.  LUNGS: Normal respirations and clear to auscultation.  HEART: Regular rhythm, with no murmurs rubs or gallops. Pulses are intact carotid dorsalis pedis and posterior tibial. No significant varicosities are noted.   ABDOMEN: Soft, flat, and nontender. No masses or organomegaly is noted. No hernias are noted. Bowel sounds are normal.  EXTREMITIES:  Good range of motion, no edema.  DATA REVIEWED:  Xray, sono new labs all noted    Quenisha Lovins J 04/26/2011  CC: No ref. provider found, DEFAULT,PROVIDER, MD, MD

## 2011-04-26 NOTE — Anesthesia Preprocedure Evaluation (Addendum)
Anesthesia Evaluation  Patient identified by MRN, date of birth, ID band Patient awake    Reviewed: Allergy & Precautions, H&P , NPO status , Patient's Chart, lab work & pertinent test results  Airway Mallampati: II TM Distance: >3 FB Neck ROM: full    Dental No notable dental hx.    Pulmonary neg pulmonary ROS,  breath sounds clear to auscultation  Pulmonary exam normal       Cardiovascular Exercise Tolerance: Good negative cardio ROS  Rhythm:regular Rate:Normal     Neuro/Psych negative neurological ROS  negative psych ROS   GI/Hepatic negative GI ROS, Neg liver ROS, GERD-  ,  Endo/Other  negative endocrine ROS  Renal/GU negative Renal ROS  negative genitourinary   Musculoskeletal   Abdominal   Peds  Hematology negative hematology ROS (+)   Anesthesia Other Findings   Reproductive/Obstetrics negative OB ROS                           Anesthesia Physical Anesthesia Plan  ASA: II  Anesthesia Plan: General   Post-op Pain Management:    Induction:   Airway Management Planned: Oral ETT  Additional Equipment:   Intra-op Plan:   Post-operative Plan:   Informed Consent: I have reviewed the patients History and Physical, chart, labs and discussed the procedure including the risks, benefits and alternatives for the proposed anesthesia with the patient or authorized representative who has indicated his/her understanding and acceptance.   Dental Advisory Given  Plan Discussed with: CRNA  Anesthesia Plan Comments:        Anesthesia Quick Evaluation

## 2011-04-26 NOTE — Op Note (Signed)
Tamara Chandler Sep 01, 1965 [id number] 03/30/2011  Preoperative diagnosis: Chronic calculus cholecystitis  Postoperative diagnosis: Same  Procedure: Laparoscopic cholecystectomy with operative cholangiogram  Surgeon: Currie Paris, MD, FACS  Assistant surgeon: Blima Rich, RNFA   Anesthesia: General  Clinical History and Indications: This patient has known gallstones and comes in today for cholecystectomy.  Description of procedure: The patient was seen in the preoperative area. I reviewed the plans for the procedure with her as well as the risks and complications. She had no further questions.  The patient was taken to the operating room. After satisfactory general endotracheal anesthesia had been obtained the abdomen was prepped and draped. A time out was done.  0.25% plain Marcaine was used at all incisions. I made an umbilical incision, identified the fascia and opened that, and entered the peritoneal cavity under direct vision. A 0 Vicryl pursestring suture was placed and the Hasson cannula was introduced under direct vision and secured with the pursestring. The abdomen was inflated to 15 cm.  The camera was placed and there were no gross abnormalities. The patient was then placed in reverse Trendelenburg and tilted to the left. A 10/11 trocar was placed in the epigastrium and two 5 mm trochars placed laterally all under direct vision.  The gallbladder was retracted over the liver. There is a large stone near the cystic duct gallbladder junction. I was able to retract into the gallbladder inferiorly and laterally and opened the peritoneum and identified the cystic duct and the anterior branch of the cystic artery. A clip was placed on each.  An intraoperative cholangiogram was then performed. A Cook catheter was introduced percutaneously and placed in the cystic duct. The cholangiogram showed good filling of the common duct and hepatic radicals and free flow into the  duodenum. No abnormalities were noted.  The catheter was removed and 3 clips placed on the stay side of the cystic duct. The duct was then divided.  Additional clips are placed on the cystic artery and it was divided. The gallbladder was then removed from below to above the coagulation current of the cautery. It was then placed in a bag to be retrieved later.There is a tiny amount of bile spilled but no gallstones.  The abdomen was irrigated and a check for hemostasis along the bed of the gallbladder made. There was a small area of bile staining along the bed of the gallbladder. I couldn't definitely identify a leak. However I was concerned that she might have a small duct of Luschka. I therefore put a 19 Blake drain and bringing it out the lateral port.Once everything appeared to be dry we were able to move the camera to the epigastric port and removed the gallbladder through the umbilical port.  The abdomen was reinsufflated and a final check for hemostasis made. There is no evidence of bleeding or bile leakage.The drain appear to be in a good position. The lateral ports were removed under direct vision and there was no bleeding. The umbilical site was closed with a pursestring,and additional suture watching with the camera in the epigastric port. The abdomen was then deflated through the epigastric port and that was removed. Skin was closed with 4-0 Monocryl subcuticular and Dermabond.  The patient tolerated the procedure well. There were no operative complications. EBL was minimal. All counts were correct.  Currie Paris, MD, FACS 04/26/2011 9:59 AM

## 2011-04-27 MED ORDER — HYDROMORPHONE HCL 2 MG PO TABS
2.0000 mg | ORAL_TABLET | ORAL | Status: AC | PRN
Start: 2011-04-27 — End: 2011-05-07

## 2011-04-27 NOTE — Progress Notes (Signed)
She is doing well. Not much pain, abd soft, drain thin. She can go home but needs to keep drain another few days

## 2011-04-27 NOTE — Progress Notes (Signed)
1 Day Post-Op  Subjective: Looks good, still sore, eating well.  Would like drain out if possible.    Objective: Vital signs in last 24 hours: Temp:  [97.4 F (36.3 C)-98.9 F (37.2 C)] 98 F (36.7 C) (04/11 0610) Pulse Rate:  [66-98] 66  (04/11 0610) Resp:  [16-18] 18  (04/11 0610) BP: (108-120)/(58-80) 118/80 mmHg (04/11 0610) SpO2:  [95 %-99 %] 99 % (04/11 0610) Weight:  [104.01 kg (229 lb 4.8 oz)] 104.01 kg (229 lb 4.8 oz) (04/10 1129)    Intake/Output from previous day: 04/10 0701 - 04/11 0700 In: 3550 [P.O.:1200; I.V.:2300] Out: 2153 [Urine:1800; Drains:128; Blood:25] Intake/Output this shift: Total I/O In: 240 [P.O.:240] Out: 300 [Urine:300]  General appearance: alert, cooperative and no distress GI: soft, tender, + Bs, drain clear serous-bloody drainage, no bile, +BS/flatus.  Lab Results:  No results found for this basename: WBC:2,HGB:2,HCT:2,PLT:2 in the last 72 hours  BMET No results found for this basename: NA:2,K:2,CL:2,CO2:2,GLUCOSE:2,BUN:2,CREATININE:2,CALCIUM:2 in the last 72 hours PT/INR No results found for this basename: LABPROT:2,INR:2 in the last 72 hours  No results found for this basename: AST:5,ALT:5,ALKPHOS:5,BILITOT:5,PROT:5,ALBUMIN:5 in the last 168 hours   Lipase     Component Value Date/Time   LIPASE 49 02/15/2011 1400     Studies/Results: Dg Cholangiogram Operative  04/26/2011  *RADIOLOGY REPORT*  Clinical Data:   Cholelithiasis  INTRAOPERATIVE CHOLANGIOGRAM  Technique:  Cholangiographic images from the C-arm fluoroscopic device were submitted for interpretation post-operatively.  Please see the procedural report for the amount of contrast and the fluoroscopy time utilized.  Comparison:  None  Findings:  No persistent filling defects in the common duct. Intrahepatic ducts are incompletely visualized, appearing decompressed centrally. Contrast passes into the duodenum.  IMPRESSION  Negative for retained common duct stone.  Original Report  Authenticated By: Osa Craver, M.D.    Medications:    .  ceFAZolin (ANCEF) IV  2 g Intravenous 60 min Pre-Op  . heparin  5,000 Units Subcutaneous Q8H  . HYDROmorphone      . HYDROmorphone        Assessment/Plan Chronic calculus cholecystitis   Plan:  She can go home but would like drain out. I will wait for Dr. Jamey Ripa to see before D/C.   LOS: 1 day    Neely Kammerer 04/27/2011

## 2011-04-29 NOTE — Discharge Summary (Signed)
  Patient ID: Tamara Chandler [id number] 45 y.o. 12/11/1965  04/26/2011  Discharge date and time: 04/27/2011  3:34 PM  Admitting Physician: Currie Paris  Discharge Physician: Currie Paris  Admission Diagnoses: GALLSTONES  Discharge Diagnoses: Chronic calculous cholecystitis  Operations: Procedure(s): LAPAROSCOPIC CHOLECYSTECTOMY WITH INTRAOPERATIVE CHOLANGIOGRAM    Discharged Condition: good  Indication for Admission: Post surgery care  Hospital Course: Did well after surgery and able to go home POD 1  Consults: None  Significant Diagnostic Studies: IOC WNL    Disposition: Home  Patient Instructions:   Devri, Kreher  Home Medication Instructions [id number]   Printed on:04/29/11 1127  Medication Information                    acetaminophen (TYLENOL) 500 MG tablet Take 1,000 mg by mouth every 6 (six) hours as needed. For pain           Simethicone (GAS RELIEF 80 PO) Take 2 tablets by mouth 2 (two) times daily as needed. Gas            aspirin 325 MG tablet Take 325 mg by mouth every 4 (four) hours as needed. Pain            oxyCODONE-acetaminophen (PERCOCET) 5-325 MG per tablet Take 1 tablet by mouth every 6 (six) hours as needed. Pain            ferrous gluconate (FERGON) 324 MG tablet Take 324 mg by mouth 2 (two) times daily.           guaiFENesin (MUCINEX) 600 MG 12 hr tablet Take 600 mg by mouth daily.           HYDROmorphone (DILAUDID) 2 MG tablet Take 1 tablet (2 mg total) by mouth every 4 (four) hours as needed.             Activity: See discharge instructions  Diet: regular diet  Wound Care: none needed  Follow-up:  With Marcianna Daily in 2 weeks.

## 2011-05-03 ENCOUNTER — Ambulatory Visit (INDEPENDENT_AMBULATORY_CARE_PROVIDER_SITE_OTHER): Payer: BC Managed Care – PPO | Admitting: General Surgery

## 2011-05-03 DIAGNOSIS — Z4803 Encounter for change or removal of drains: Secondary | ICD-10-CM

## 2011-05-03 DIAGNOSIS — Z4889 Encounter for other specified surgical aftercare: Secondary | ICD-10-CM

## 2011-05-03 NOTE — Progress Notes (Signed)
Patient comes in status post lap chole with JP drain in place. Patient feeling ok. Incisions look good. JP drain has been draining about 30 cc a day, serosanguinous fluid. Drain was removed without difficulty and drain site covered with dry gauze, and triple antibiotic. Patient instructed to keep bandage on for 24 hours and to keep covered only if draining after. Appt made for follow up with Dr Jamey Ripa next week.

## 2011-05-03 NOTE — Patient Instructions (Signed)
Keep appt with Dr Jamey Ripa. Call with any increased pain, redness, drainage or fevers.

## 2011-05-09 ENCOUNTER — Encounter (INDEPENDENT_AMBULATORY_CARE_PROVIDER_SITE_OTHER): Payer: Self-pay | Admitting: Surgery

## 2011-05-09 ENCOUNTER — Ambulatory Visit (INDEPENDENT_AMBULATORY_CARE_PROVIDER_SITE_OTHER): Payer: BC Managed Care – PPO | Admitting: Surgery

## 2011-05-09 VITALS — BP 132/88 | HR 70 | Temp 97.4°F | Resp 18 | Ht 69.0 in | Wt 225.5 lb

## 2011-05-09 DIAGNOSIS — K8020 Calculus of gallbladder without cholecystitis without obstruction: Secondary | ICD-10-CM

## 2011-05-09 NOTE — Patient Instructions (Signed)
We will see you again on an as needed basis. Please call the office at [phone] if you have any questions or concerns. Thank you for allowing us to take care of you.  

## 2011-05-09 NOTE — Progress Notes (Signed)
NAME: Tamara Chandler       DOB: 1965-04-25           DATE: 05/09/2011       ZOX:[id number]   CC: Postop laparoscopic cholecystectomy  HPI:  This patient underwent a laparoscopic cholecystectomy with operative cholangiogram on 04/26/11. She is in for her first postoperative visit. She notes that her incisional pain has resolved. Her preoperative symptoms have improved. She is not having problems with nausea, vomiting, diarrhea, fevers, chills, or urinary symptoms. She is tolerating diet. She feels that she is progressing well and nearly back to normal. PE: General: The patient is alert and appears comfortable, NAD.  Abdomen: Soft and benign. The incisions are healing nicely. There are no apparent problems.  Data reviewed: IOC:  Normal Pathology:  Chronic cholecystitis and cholelithiasis  Impression:  The patient appears to be doing well, with improvement in her symptoms.  Plan:  She may resume full activity and regular diet. She  will followup with [place] on a p.r.n. basis. I did tell her that she may still have some foods that cause indigestion and ask her to call us if there are any questions, problems or concerns.

## 2011-05-10 ENCOUNTER — Encounter (HOSPITAL_COMMUNITY): Payer: Self-pay | Admitting: Surgery

## 2012-09-21 IMAGING — CT CT ABD-PELV W/O CM
2 of 4 series · 17 of 46 positions shown, 19 images · non-contrast
Comparison: None.

CLINICAL DATA: Right flank pain.

CT ABDOMEN AND PELVIS WITHOUT CONTRAST
TECHNIQUE: Multidetector CT imaging of the abdomen and pelvis was
performed following the standard protocol without intravenous
contrast.

[Series 2: a/p w/o 5.0 b31f st · axial · non-contrast · 0.72mm/px · z∈[+784,+1218]mm · 14 of 95 slices shown, 16 images]
[im 4/95  soft-tissue]
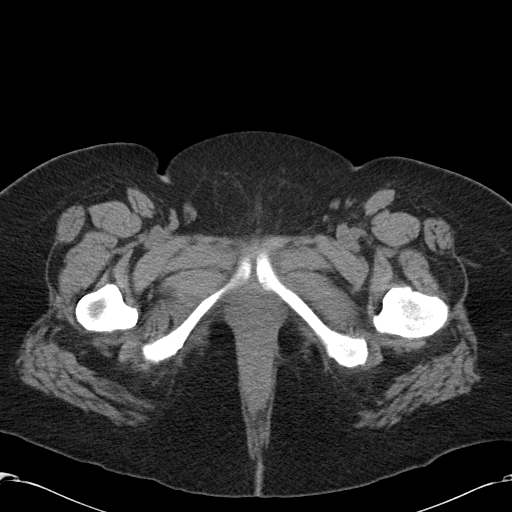
[im 4/95  bone]
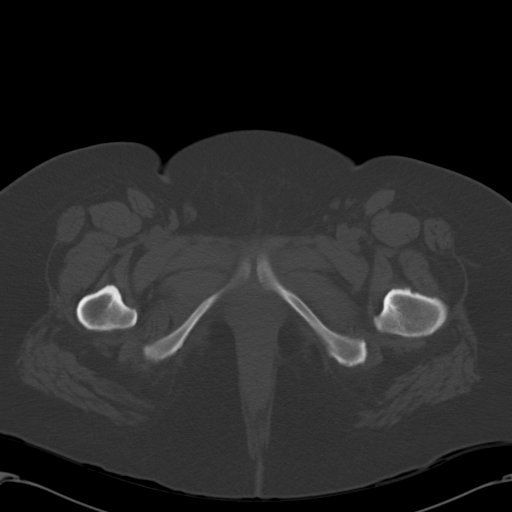
[im 12/95  soft-tissue]
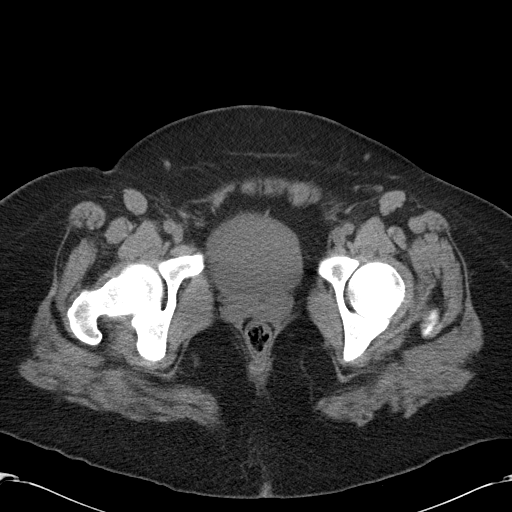
[im 19/95  soft-tissue]
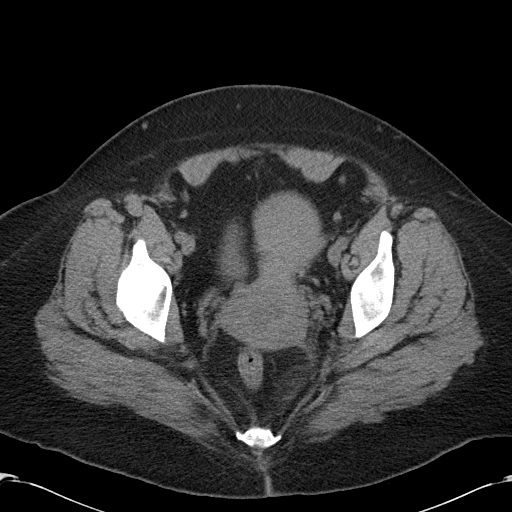
[im 27/95  soft-tissue]
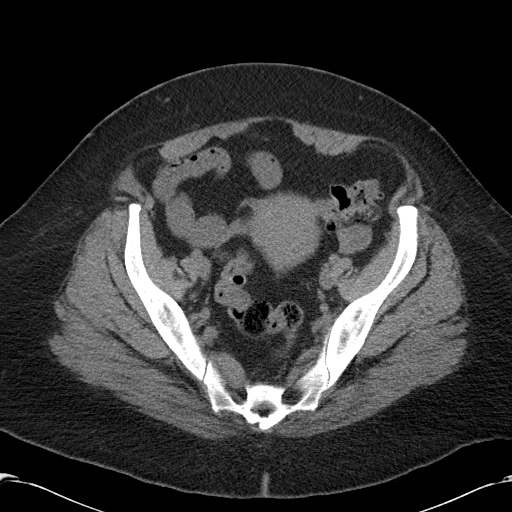
[im 31/95  soft-tissue]
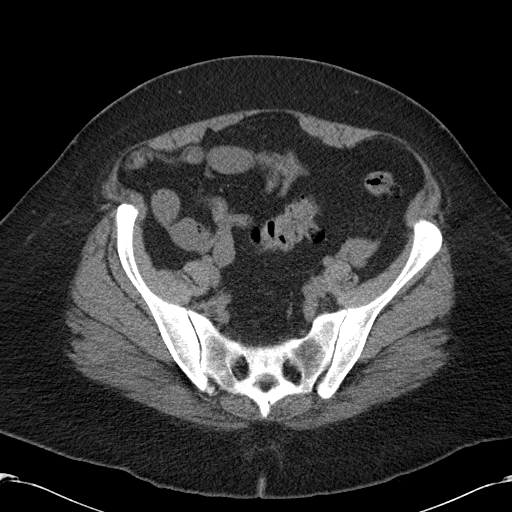
[im 38/95  soft-tissue]
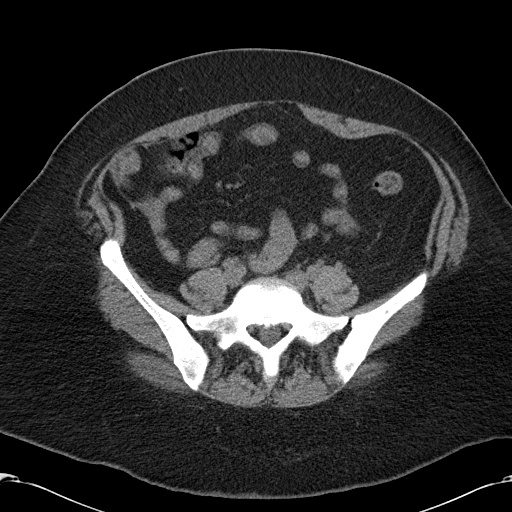
[im 46/95  soft-tissue]
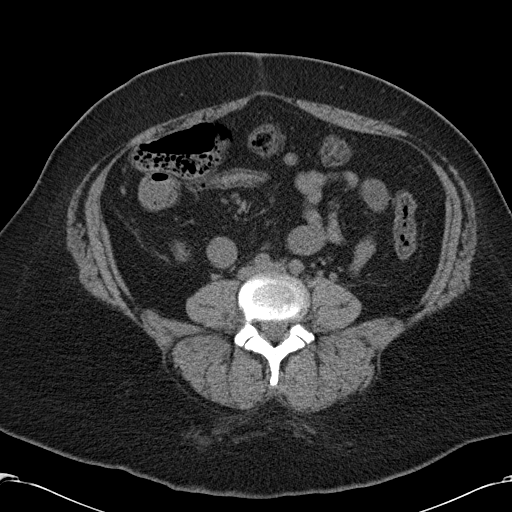
[im 49/95  soft-tissue]
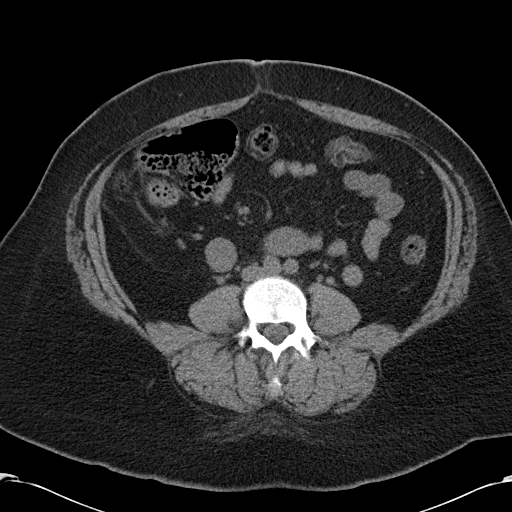
[im 57/95  soft-tissue]
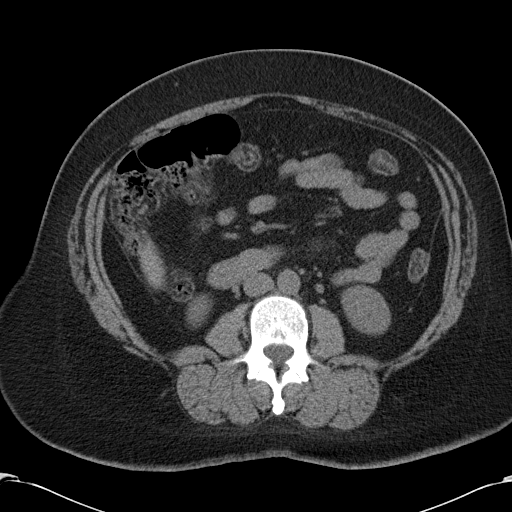
[im 57/95  bone]
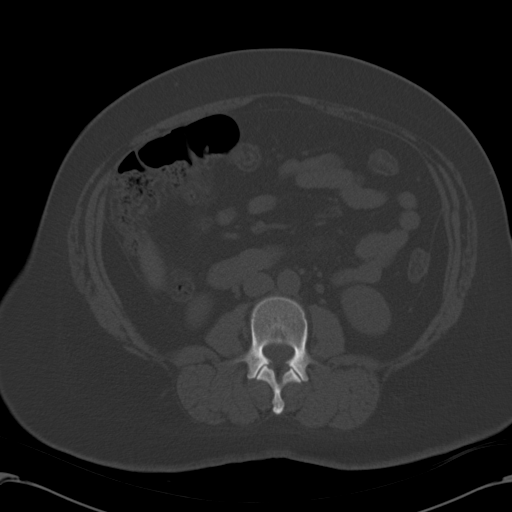
[im 64/95  soft-tissue]
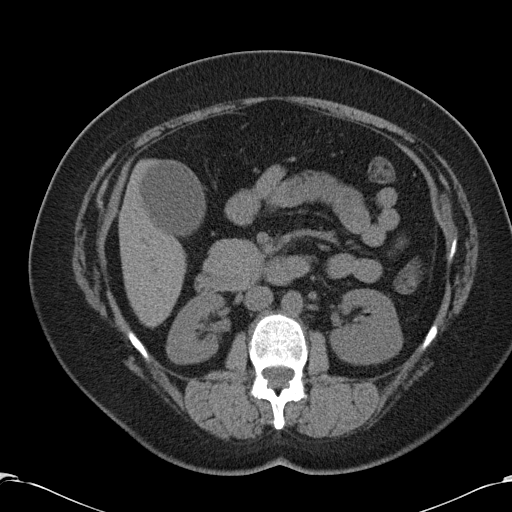
[im 72/95  soft-tissue]
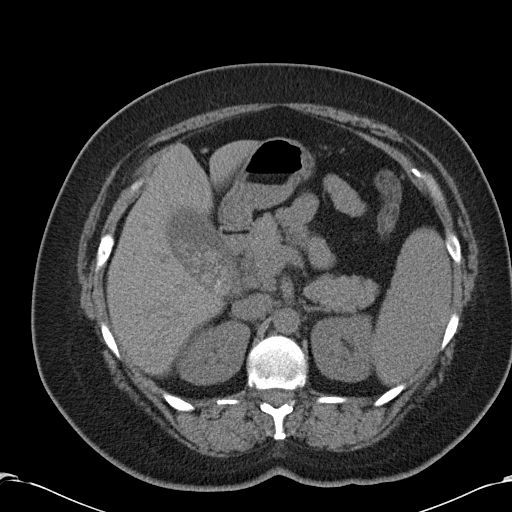
[im 76/95  soft-tissue]
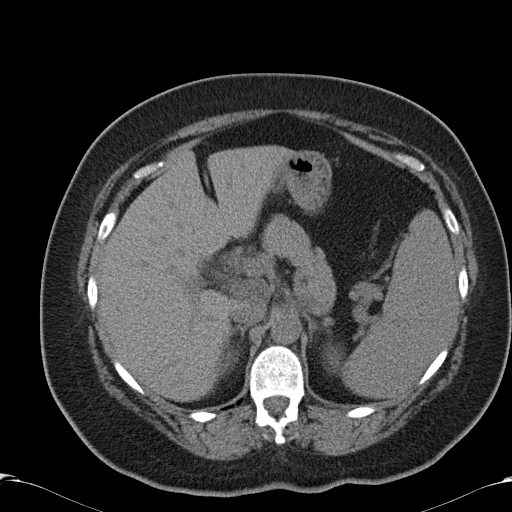
[im 83/95  soft-tissue]
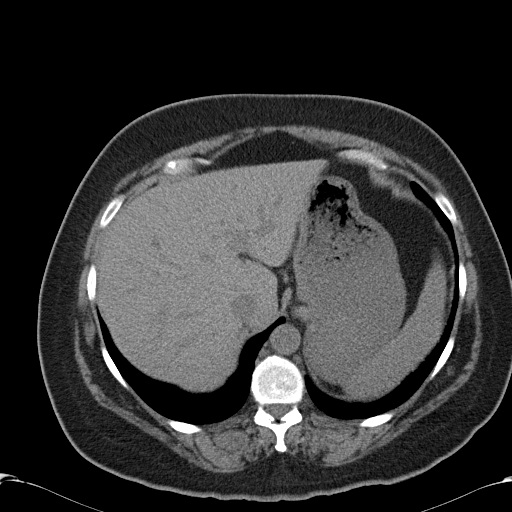
[im 91/95  soft-tissue]
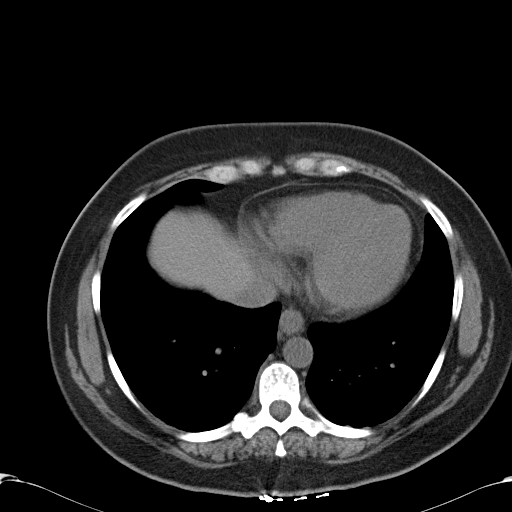

[Series 5: coronals · coronal · 0.92mm/px · 3 of 122 slices shown]
[im 41/122  soft-tissue]
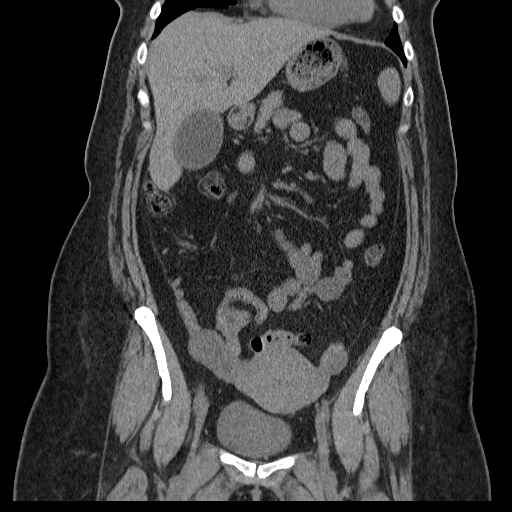
[im 54/122  soft-tissue]
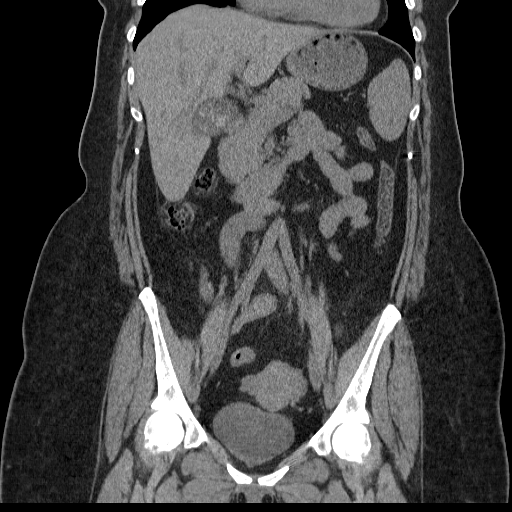
[im 68/122  soft-tissue]
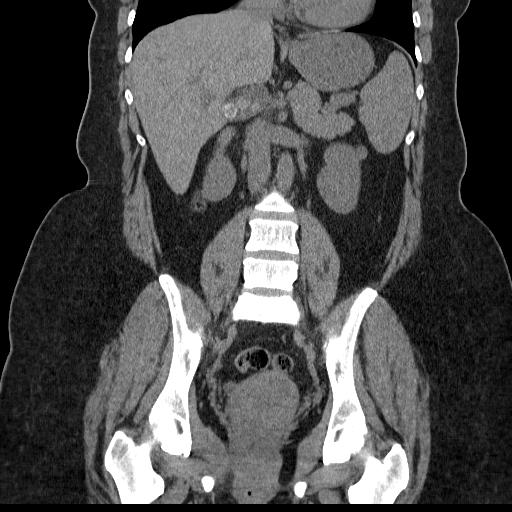

[17 of 46 positions shown; findings below may reference images not displayed]

FINDINGS: Lung bases are clear.  No effusions.  Heart is normal
size.

Numerous stones layering within the gallbladder.  Gallbladder is
mildly distended.  No visible pericholecystic fluid.  Liver,
spleen, pancreas, adrenals and kidneys have an unremarkable
unenhanced appearance.  No renal or ureteral stones.  No
hydronephrosis.

Cervix appears prominent with areas of central low density.  This
cannot be characterized on this noncontrast CT.  Recommend
correlation with physical exam.  Cannot completely exclude cervical
mass.

Adnexa unremarkable.  There is sigmoid diverticulosis.  No active
diverticulitis.  Small bowel is decompressed.  Aorta is normal
caliber.  Multiple calcified phleboliths in the anatomic pelvis.

No acute bony abnormality.
IMPRESSION: Cholelithiasis.  Mild gallbladder distention.

Normal appendix.

No renal or ureteral stones.

Prominent, heterogeneous cervix.  Cannot completely exclude mass.
Recommend correlation with pelvic exam.  If felt clinically
indicated, this can be further evaluated with non emergent pelvic
ultrasound.

## 2013-01-07 IMAGING — RF DG CHOLANGIOGRAM OPERATIVE
1 series · 8 of 8 positions shown · non-contrast
Comparison: None

CLINICAL DATA: Cholelithiasis

INTRAOPERATIVE CHOLANGIOGRAM
TECHNIQUE: Cholangiographic images from the C-arm fluoroscopic
device were submitted for interpretation post-operatively.  Please
see the procedural report for the amount of contrast and the
fluoroscopy time utilized.

[Series 1: run · 2 acquisitions, 8 frames shown]
[im 1/2]
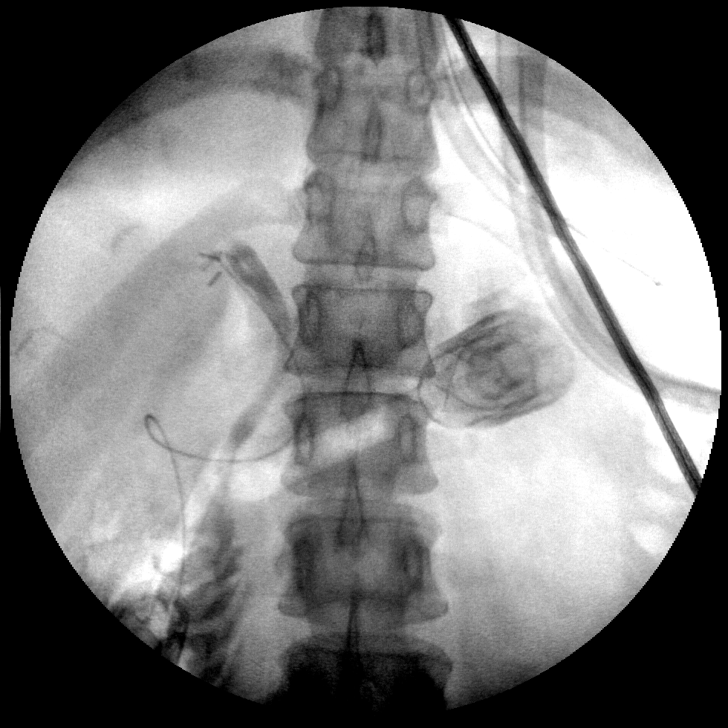
[im 1/2]
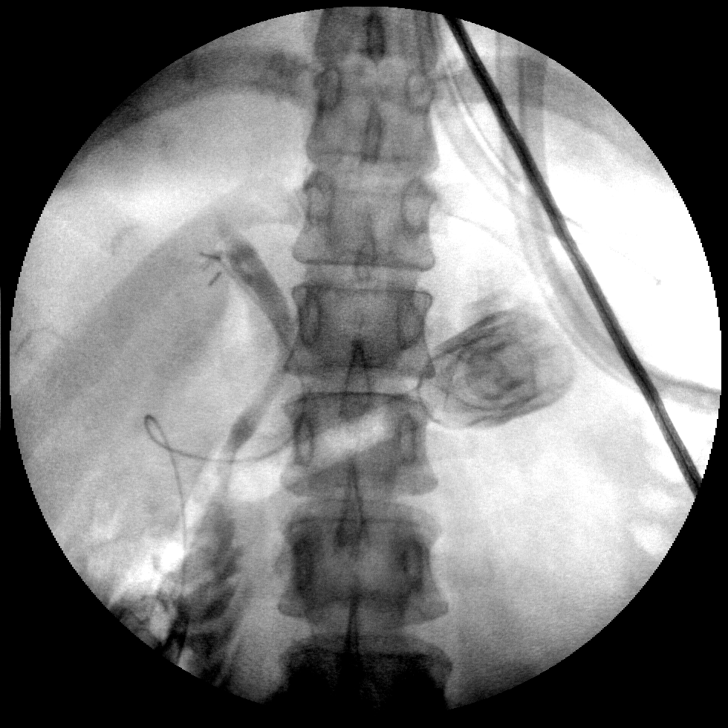
[im 1/2]
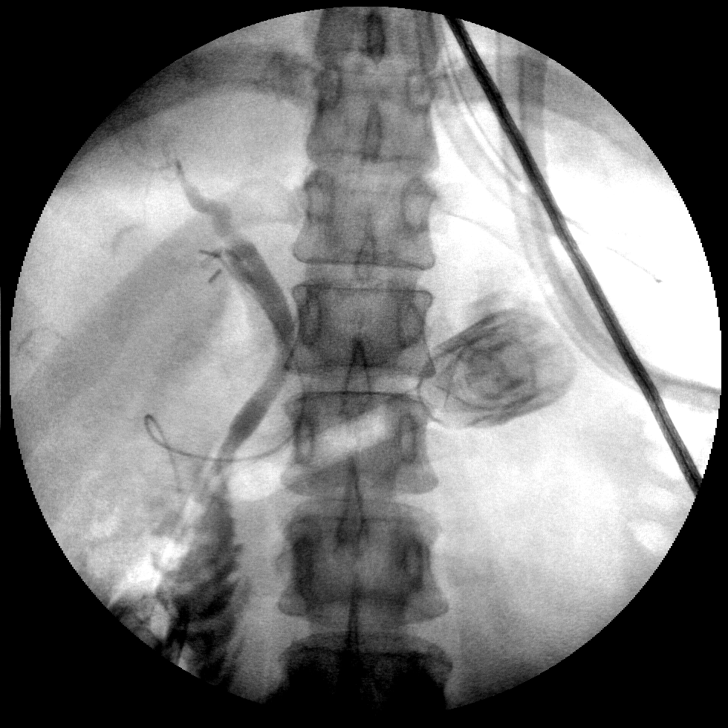
[im 1/2]
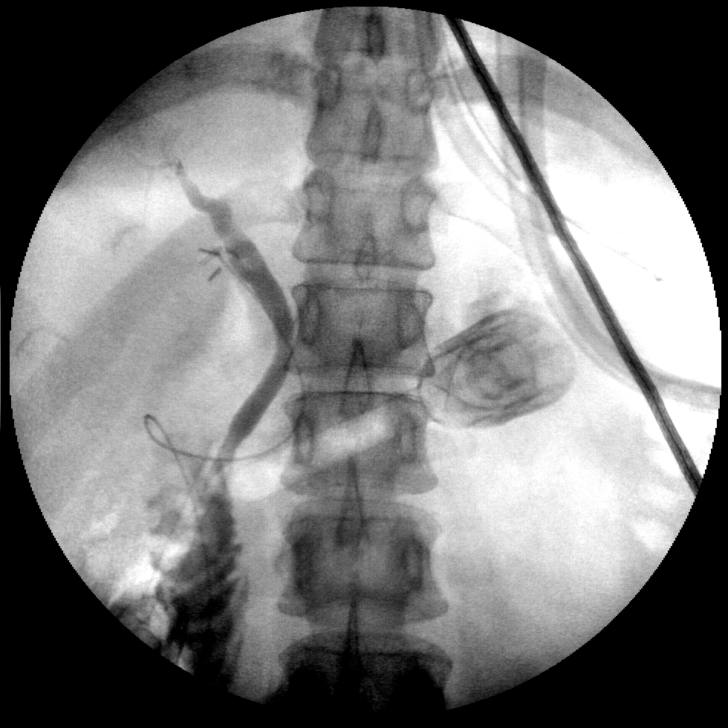
[im 2/2]
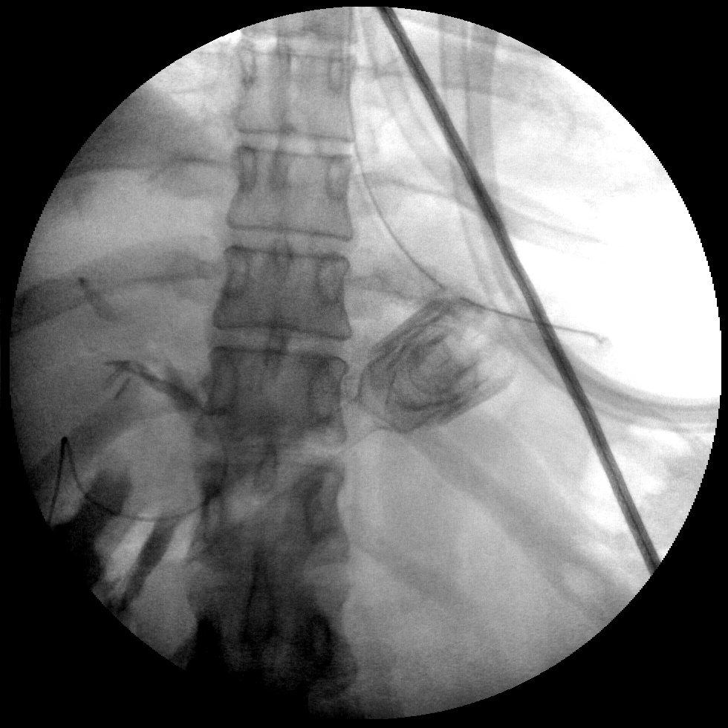
[im 2/2]
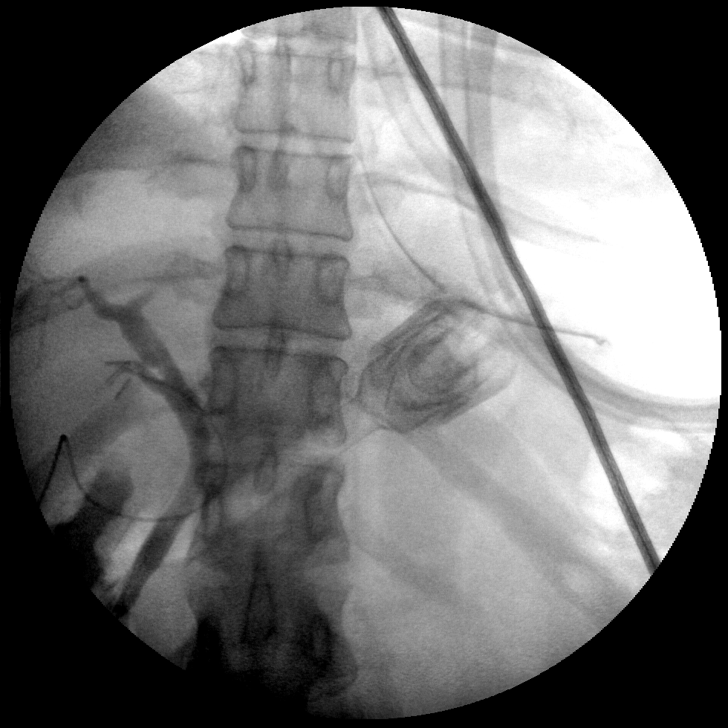
[im 2/2]
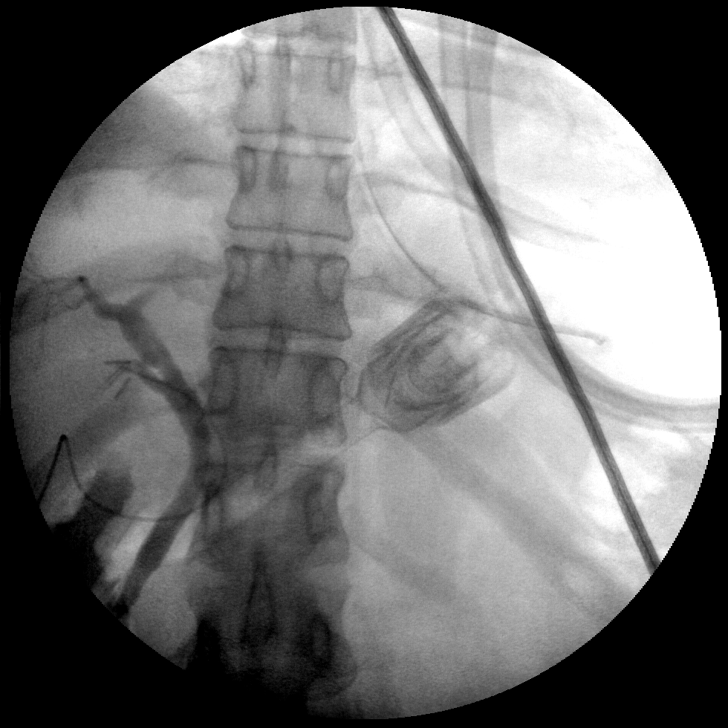
[im 2/2]
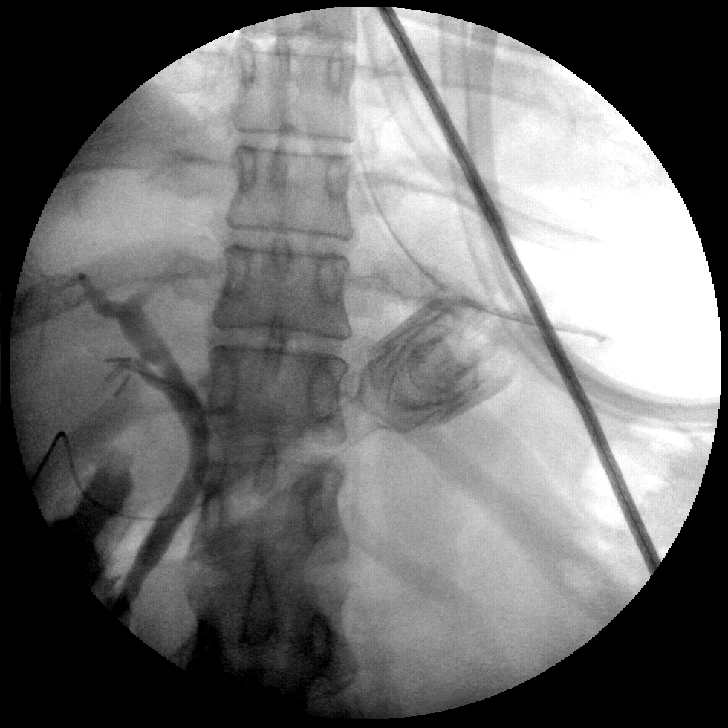

[8 of 8 positions shown; findings below may reference images not displayed]

FINDINGS: No persistent filling defects in the common duct.
Intrahepatic ducts are incompletely visualized, appearing
decompressed centrally. Contrast passes into the duodenum.

IMPRESSION

Negative for retained common duct stone.

## 2013-03-19 ENCOUNTER — Observation Stay (HOSPITAL_COMMUNITY)
Admission: AD | Admit: 2013-03-19 | Discharge: 2013-03-20 | Disposition: A | Discharge status: Home / Self Care | Payer: BC Managed Care – PPO | Source: Ambulatory Visit | Attending: Obstetrics & Gynecology | Admitting: Obstetrics & Gynecology

## 2013-03-19 ENCOUNTER — Encounter (HOSPITAL_COMMUNITY): Payer: Self-pay | Admitting: *Deleted

## 2013-03-19 DIAGNOSIS — K219 Gastro-esophageal reflux disease without esophagitis: Secondary | ICD-10-CM | POA: Insufficient documentation

## 2013-03-19 DIAGNOSIS — D649 Anemia, unspecified: Secondary | ICD-10-CM | POA: Insufficient documentation

## 2013-03-19 DIAGNOSIS — N920 Excessive and frequent menstruation with regular cycle: Principal | ICD-10-CM | POA: Insufficient documentation

## 2013-03-19 DIAGNOSIS — N840 Polyp of corpus uteri: Secondary | ICD-10-CM | POA: Insufficient documentation

## 2013-03-19 DIAGNOSIS — R0602 Shortness of breath: Secondary | ICD-10-CM | POA: Insufficient documentation

## 2013-03-19 DIAGNOSIS — R5381 Other malaise: Secondary | ICD-10-CM | POA: Insufficient documentation

## 2013-03-19 DIAGNOSIS — R5383 Other fatigue: Secondary | ICD-10-CM

## 2013-03-19 DIAGNOSIS — D62 Acute posthemorrhagic anemia: Secondary | ICD-10-CM

## 2013-03-19 DIAGNOSIS — N939 Abnormal uterine and vaginal bleeding, unspecified: Secondary | ICD-10-CM | POA: Diagnosis present

## 2013-03-19 HISTORY — DX: Unspecified infectious disease: B99.9

## 2013-03-19 LAB — CBC
HCT: 19 % — ABNORMAL LOW (ref 36.0–46.0)
Hemoglobin: 5 g/dL — CL (ref 12.0–15.0)
MCH: 15.5 pg — AB (ref 26.0–34.0)
MCHC: 26.3 g/dL — ABNORMAL LOW (ref 30.0–36.0)
MCV: 58.8 fL — ABNORMAL LOW (ref 78.0–100.0)
PLATELETS: 360 10*3/uL (ref 150–400)
RBC: 3.23 MIL/uL — AB (ref 3.87–5.11)
RDW: 20.3 % — ABNORMAL HIGH (ref 11.5–15.5)
WBC: 9.6 10*3/uL (ref 4.0–10.5)

## 2013-03-19 LAB — PREPARE RBC (CROSSMATCH)

## 2013-03-19 LAB — ABO/RH: ABO/RH(D): O POS

## 2013-03-19 MED ORDER — CEFAZOLIN SODIUM-DEXTROSE 2-3 GM-% IV SOLR
2.0000 g | Freq: Once | INTRAVENOUS | Status: AC
  Administered 2013-03-20: 2 g via INTRAVENOUS
  Filled 2013-03-19: qty 50

## 2013-03-19 MED ORDER — ZOLPIDEM TARTRATE 5 MG PO TABS: 5.0000 mg | ORAL_TABLET | Freq: Every evening | ORAL | Status: DC | PRN

## 2013-03-19 MED ORDER — DIPHENHYDRAMINE HCL 25 MG PO CAPS
25.0000 mg | ORAL_CAPSULE | Freq: Once | ORAL | Status: AC
  Administered 2013-03-19: 25 mg via ORAL
  Filled 2013-03-19: qty 1

## 2013-03-19 MED ORDER — SODIUM CHLORIDE 0.9 % IV SOLN
INTRAVENOUS | Status: DC
  Administered 2013-03-19: 15:00:00 via INTRAVENOUS

## 2013-03-19 MED ORDER — ACETAMINOPHEN 325 MG PO TABS
650.0000 mg | ORAL_TABLET | Freq: Four times a day (QID) | ORAL | Status: DC | PRN
  Administered 2013-03-19: 650 mg via ORAL
  Filled 2013-03-19 (×2): qty 2

## 2013-03-19 MED ORDER — ACETAMINOPHEN 325 MG PO TABS
650.0000 mg | ORAL_TABLET | Freq: Once | ORAL | Status: AC
  Administered 2013-03-19: 650 mg via ORAL
  Filled 2013-03-19: qty 2

## 2013-03-19 NOTE — H&P (Signed)
Tamara Chandler is an 48 y.o. female. She is admitted with persistent, heavy vaginal bleeding for 3 weeks, weakness, DOE, and anemia.  She has a history of menorrhagia going back at least 2 years.  She had known anemia in Jan 2013 when she had a lap chole.  She had a CT scan in 2012 that showed a question of a cervical mass.  She saw Dr. Dierdre ForthVanessa Chandler in Dec. 2012 for this finding and the workup was negative (normal Pap, normal ECC, normal endometrial aspiration biopsy).    Pertinent Gynecological History:  Last pap: normal Date:12/2010 OB History: G3, P3 (2 vaginal, last per C/S)   Menstrual History: Patient's last menstrual period was 02/22/2013.    Past Medical History  Diagnosis Date  . GERD (gastroesophageal reflux disease)   . Headache(784.0)   . Anemia   . Preterm labor   . Infection     UTI    Past Surgical History  Procedure Laterality Date  . Vaginal deliveries    . Cesarean section    . Toe surgery  2003    remove sewing needle from toe  . Cholecystectomy  04/26/11  . Cholecystectomy  04/26/2011    Procedure: LAPAROSCOPIC CHOLECYSTECTOMY WITH INTRAOPERATIVE CHOLANGIOGRAM;  Surgeon: Tamara Parishristian J Streck, MD;  Location: [facility];  Service: General;  Laterality: N/A;  Lap Chole w/IOC    Family History  Problem Relation Age of Onset  . Hypertension Mother   . Diabetes Father     Social History:  reports that she has never smoked. She has never used smokeless tobacco. She reports that she does not drink alcohol or use illicit drugs.  Allergies: No Known Allergies  Prescriptions prior to admission  Medication Sig Dispense Refill  . acetaminophen (TYLENOL) 500 MG tablet Take 1,000 mg by mouth as needed. For pain      . aspirin 325 MG tablet Take 325 mg by mouth as needed. Pain       . Docusate Calcium (STOOL SOFTENER PO) Take 1 tablet by mouth daily as needed (constipation).      . ferrous gluconate (FERGON) 324 MG tablet Take 324 mg by mouth 2 (two) times daily.       Marland Kitchen. ibuprofen (ADVIL,MOTRIN) 200 MG tablet Take 200 mg by mouth every 6 (six) hours as needed for headache.        Review of Systems  HENT: Negative.   Respiratory: Positive for shortness of breath.   Cardiovascular: Negative.   Gastrointestinal: Negative.   Genitourinary: Negative.   Musculoskeletal: Negative.   Neurological: Positive for weakness.  Psychiatric/Behavioral: Negative.     Blood pressure 132/81, pulse 89, temperature 98.8 F (37.1 C), temperature source Oral, resp. rate 20, last menstrual period 02/22/2013, SpO2 99.00%. Physical Exam  Constitutional: She is oriented to person, place, and time. She appears well-developed and well-nourished.  HENT:  Head: Normocephalic.  Eyes: Pupils are equal, round, and reactive to light.  Neck: Neck supple. No thyromegaly present.  Cardiovascular: Normal rate.   Respiratory: Effort normal.  GI: Soft.  Genitourinary: Vagina normal and uterus normal.  Musculoskeletal: Normal range of motion.  Neurological: She is alert and oriented to person, place, and time.  Skin: Skin is warm and dry.    Results for orders placed during the hospital encounter of 03/19/13 (from the past 24 hour(s))  PREPARE RBC (CROSSMATCH)     Status: None   Collection Time    03/19/13  1:56 PM      Result  Value Ref Range   Order Confirmation ORDER PROCESSED BY BLOOD BANK    CBC     Status: Abnormal   Collection Time    03/19/13  2:02 PM      Result Value Ref Range   WBC 9.6  4.0 - 10.5 K/uL   RBC 3.23 (*) 3.87 - 5.11 MIL/uL   Hemoglobin 5.0 (*) 12.0 - 15.0 g/dL   HCT 16.1 (*) 09.6 - 04.5 %   MCV 58.8 (*) 78.0 - 100.0 fL   MCH 15.5 (*) 26.0 - 34.0 pg   MCHC 26.3 (*) 30.0 - 36.0 g/dL   RDW 40.9 (*) 81.1 - 91.4 %   Platelets 360  150 - 400 K/uL  TYPE AND SCREEN     Status: None   Collection Time    03/19/13  2:02 PM      Result Value Ref Range   ABO/RH(D) O POS     Antibody Screen NEG     Sample Expiration 03/22/2013     Unit Number  [id number]     Blood Component Type RED CELLS,LR     Unit division 00     Status of Unit ALLOCATED     Transfusion Status OK TO TRANSFUSE     Crossmatch Result Compatible     Unit Number [id number]     Blood Component Type RED CELLS,LR     Unit division 00     Status of Unit ALLOCATED     Transfusion Status OK TO TRANSFUSE     Crossmatch Result Compatible    ABO/RH     Status: None   Collection Time    03/19/13  2:08 PM      Result Value Ref Range   ABO/RH(D) O POS      No results found.  Assessment/Plan: Hemoglobin was 5.6 in office yesterday and now is 5.0.  She states she is still bleeding moderately heavily and she feels very weak.  Her vital signs are stable and she may also have a chronic anemia.  I discussed options with the patient and the decision was made to transfuse her tonight with 2 units of packed RBC and bring her to the OR tomorrow for a D&C, hysteroscopy, and Novasure endometrial ablation.  [name] D 03/19/2013, 3:35 PM

## 2013-03-19 NOTE — MAU Note (Signed)
Has had abn vag bleeding for past 3 wks, was in office yesterday and had blood work drawn.  Dr Arlyce DiceKaplan called her this morning and told her hemoglobin was very low (<6) and she was to come in for transfusion.

## 2013-03-19 NOTE — MAU Note (Signed)
CRITICAL VALUE ALERT  Critical value received:  hemoglobin of 5  Date of notification:  03/19/2013  Time of notification:  1415  Critical value read back:yes hgb 5.0  Nurse who received alert:  Lucila Maineheryl Rhiannan Kievit, RN  MD notified (1st page):  Joseph BerkshireJulie Ethier, PA notified Dr. Arlyce DiceKaplan  Time of first page: provider on unit. Reported result immediately  MD notified (2nd page):N/A  Time of second page:N/A  Responding MD:  Arlyce DiceKaplan  Time MD responded:  1416

## 2013-03-19 NOTE — MAU Provider Note (Signed)
Ms. Tamara Chandler is a 48 y.o. female who presents to MAU today for further evaluation of severe anemia. The patient was seen in the office yesterday and results of CBC showed severe anemia. Per patient her Hgb was < 6.0. Patient was instructed to come to MAU. She states a 1 month history of vaginal bleeding. She denies abdominal pain today. She states recent history of weakness, dizziness, SOB and occasional palpitations. She denies LOC.   BP 132/81  Pulse 89  Temp(Src) 98.8 F (37.1 C) (Oral)  Resp 20  SpO2 99%  LMP 02/22/2013 GENERAL: Well-developed, well-nourished female in no acute distress.  HEENT: Normocephalic, atraumatic.   LUNGS: Effort normal HEART: Regular rate  SKIN: Warm, dry and without erythema. Pale complexion.  PSYCH: Normal mood and affect  Results for orders placed during the hospital encounter of 03/19/13 (from the past 24 hour(s))  CBC     Status: Abnormal   Collection Time    03/19/13  2:02 PM      Result Value Ref Range   WBC 9.6  4.0 - 10.5 K/uL   RBC 3.23 (*) 3.87 - 5.11 MIL/uL   Hemoglobin 5.0 (*) 12.0 - 15.0 g/dL   HCT 16.119.0 (*) 09.636.0 - 04.546.0 %   MCV 58.8 (*) 78.0 - 100.0 fL   MCH 15.5 (*) 26.0 - 34.0 pg   MCHC 26.3 (*) 30.0 - 36.0 g/dL   RDW 40.920.3 (*) 81.111.5 - 91.415.5 %   Platelets 360  150 - 400 K/uL    MDM Critical value called to Dr. Arlyce DiceKaplan. Admit patient to women's unit for blood transfusion Type and screen pending. Prepare 2 units ordered.   A: Severe symptomatic anemia  P: Admit to Women's Unit for blood transfusion  Freddi StarrJulie N Ethier, PA-C 03/19/2013 2:20 PM

## 2013-03-19 NOTE — MAU Note (Signed)
Hx of heavy bleeding, but not for this long a period.  Hx of anemia.  Had started to feel light headed and some shortness of breath

## 2013-03-20 ENCOUNTER — Encounter (HOSPITAL_COMMUNITY): Payer: Self-pay | Admitting: Anesthesiology

## 2013-03-20 ENCOUNTER — Encounter (HOSPITAL_COMMUNITY)
Admission: AD | Disposition: A | Discharge status: Home / Self Care | Payer: Self-pay | Source: Ambulatory Visit | Attending: Obstetrics & Gynecology

## 2013-03-20 ENCOUNTER — Observation Stay (HOSPITAL_COMMUNITY): Payer: BC Managed Care – PPO | Admitting: Anesthesiology

## 2013-03-20 ENCOUNTER — Encounter (HOSPITAL_COMMUNITY): Payer: BC Managed Care – PPO | Admitting: Anesthesiology

## 2013-03-20 HISTORY — PX: DILITATION & CURRETTAGE/HYSTROSCOPY WITH NOVASURE ABLATION: SHX5568

## 2013-03-20 LAB — TYPE AND SCREEN
ABO/RH(D): O POS
Antibody Screen: NEGATIVE
UNIT DIVISION: 0
UNIT DIVISION: 0
Unit division: 0
Unit division: 0

## 2013-03-20 LAB — SURGICAL PCR SCREEN
MRSA, PCR: NEGATIVE
STAPHYLOCOCCUS AUREUS: NEGATIVE

## 2013-03-20 LAB — PREPARE RBC (CROSSMATCH)

## 2013-03-20 SURGERY — DILATATION & CURETTAGE/HYSTEROSCOPY WITH NOVASURE ABLATION
Anesthesia: General | Site: Vagina

## 2013-03-20 MED ORDER — MIDAZOLAM HCL 5 MG/5ML IJ SOLN
INTRAMUSCULAR | Status: DC | PRN
  Administered 2013-03-20: 2 mg via INTRAVENOUS

## 2013-03-20 MED ORDER — LACTATED RINGERS IV SOLN
INTRAVENOUS | Status: DC | PRN
  Administered 2013-03-20: 10:00:00 via INTRAVENOUS

## 2013-03-20 MED ORDER — LIDOCAINE HCL (CARDIAC) 20 MG/ML IV SOLN
INTRAVENOUS | Status: AC
  Filled 2013-03-20: qty 5

## 2013-03-20 MED ORDER — HYDROMORPHONE HCL PF 1 MG/ML IJ SOLN: 0.2500 mg | INTRAMUSCULAR | Status: DC | PRN

## 2013-03-20 MED ORDER — PROPOFOL 10 MG/ML IV EMUL
INTRAVENOUS | Status: AC
  Filled 2013-03-20: qty 20

## 2013-03-20 MED ORDER — LIDOCAINE HCL (CARDIAC) 20 MG/ML IV SOLN
INTRAVENOUS | Status: DC | PRN
  Administered 2013-03-20: 50 mg via INTRAVENOUS

## 2013-03-20 MED ORDER — FENTANYL CITRATE 0.05 MG/ML IJ SOLN
INTRAMUSCULAR | Status: DC | PRN
  Administered 2013-03-20 (×2): 25 ug via INTRAVENOUS
  Administered 2013-03-20: 50 ug via INTRAVENOUS

## 2013-03-20 MED ORDER — DEXAMETHASONE SODIUM PHOSPHATE 10 MG/ML IJ SOLN
INTRAMUSCULAR | Status: AC
  Filled 2013-03-20: qty 1

## 2013-03-20 MED ORDER — HYDROCODONE-ACETAMINOPHEN 5-325 MG PO TABS: 1.0000 | ORAL_TABLET | Freq: Four times a day (QID) | ORAL | Status: DC | PRN

## 2013-03-20 MED ORDER — LIDOCAINE HCL 2 % IJ SOLN
INTRAMUSCULAR | Status: DC | PRN
  Administered 2013-03-20: 10 mL

## 2013-03-20 MED ORDER — METOCLOPRAMIDE HCL 5 MG/ML IJ SOLN: 10.0000 mg | Freq: Once | INTRAMUSCULAR | Status: DC | PRN

## 2013-03-20 MED ORDER — KETOROLAC TROMETHAMINE 30 MG/ML IJ SOLN
INTRAMUSCULAR | Status: DC | PRN
  Administered 2013-03-20: 30 mg via INTRAVENOUS

## 2013-03-20 MED ORDER — ONDANSETRON HCL 4 MG/2ML IJ SOLN
INTRAMUSCULAR | Status: DC | PRN
  Administered 2013-03-20: 4 mg via INTRAVENOUS

## 2013-03-20 MED ORDER — LACTATED RINGERS IV SOLN: INTRAVENOUS | Status: DC | PRN

## 2013-03-20 MED ORDER — MIDAZOLAM HCL 2 MG/2ML IJ SOLN
INTRAMUSCULAR | Status: AC
  Filled 2013-03-20: qty 2

## 2013-03-20 MED ORDER — ONDANSETRON HCL 4 MG/2ML IJ SOLN
INTRAMUSCULAR | Status: AC
  Filled 2013-03-20: qty 2

## 2013-03-20 MED ORDER — DEXAMETHASONE SODIUM PHOSPHATE 10 MG/ML IJ SOLN
INTRAMUSCULAR | Status: DC | PRN
  Administered 2013-03-20: 10 mg via INTRAVENOUS

## 2013-03-20 MED ORDER — KETOROLAC TROMETHAMINE 30 MG/ML IJ SOLN
INTRAMUSCULAR | Status: AC
  Filled 2013-03-20: qty 1

## 2013-03-20 MED ORDER — LIDOCAINE HCL 2 % IJ SOLN
INTRAMUSCULAR | Status: AC
  Filled 2013-03-20: qty 20

## 2013-03-20 MED ORDER — FENTANYL CITRATE 0.05 MG/ML IJ SOLN
INTRAMUSCULAR | Status: AC
  Filled 2013-03-20: qty 2

## 2013-03-20 MED ORDER — KETOROLAC TROMETHAMINE 30 MG/ML IJ SOLN: 15.0000 mg | Freq: Once | INTRAMUSCULAR | Status: DC | PRN

## 2013-03-20 MED ORDER — HYDROCODONE-ACETAMINOPHEN 5-325 MG PO TABS
1.0000 | ORAL_TABLET | Freq: Once | ORAL | Status: AC
  Administered 2013-03-20: 1 via ORAL

## 2013-03-20 MED ORDER — LACTATED RINGERS IR SOLN
Status: DC | PRN
  Administered 2013-03-20: 3000 mL

## 2013-03-20 MED ORDER — PROPOFOL 10 MG/ML IV BOLUS
INTRAVENOUS | Status: DC | PRN
  Administered 2013-03-20: 200 mg via INTRAVENOUS

## 2013-03-20 MED ORDER — HYDROCODONE-ACETAMINOPHEN 5-325 MG PO TABS
ORAL_TABLET | ORAL | Status: AC
  Filled 2013-03-20: qty 1

## 2013-03-20 MED ORDER — MEPERIDINE HCL 25 MG/ML IJ SOLN: 6.2500 mg | INTRAMUSCULAR | Status: DC | PRN

## 2013-03-20 SURGICAL SUPPLY — 17 items
ABLATOR ENDOMETRIAL BIPOLAR (ABLATOR) ×3 IMPLANT
CATH ROBINSON RED A/P 16FR (CATHETERS) ×3 IMPLANT
CLOTH BEACON ORANGE TIMEOUT ST (SAFETY) ×3 IMPLANT
CONTAINER PREFILL 10% NBF 60ML (FORM) IMPLANT
DRAPE HYSTEROSCOPY (DRAPE) ×3 IMPLANT
DRSG TELFA 3X8 NADH (GAUZE/BANDAGES/DRESSINGS) ×3 IMPLANT
GLOVE ECLIPSE 6.0 STRL STRAW (GLOVE) ×6 IMPLANT
GOWN STRL REUS W/TWL LRG LVL3 (GOWN DISPOSABLE) ×6 IMPLANT
NEEDLE SPNL 22GX3.5 QUINCKE BK (NEEDLE) ×3 IMPLANT
PACK VAGINAL MINOR WOMEN LF (CUSTOM PROCEDURE TRAY) ×3 IMPLANT
PAD OB MATERNITY 4.3X12.25 (PERSONAL CARE ITEMS) ×3 IMPLANT
PAD PREP 24X48 CUFFED NSTRL (MISCELLANEOUS) ×3 IMPLANT
SET TUBING HYSTEROSCOPY 2 NDL (TUBING) ×3 IMPLANT
SYR CONTROL 10ML LL (SYRINGE) ×3 IMPLANT
TOWEL OR 17X24 6PK STRL BLUE (TOWEL DISPOSABLE) ×6 IMPLANT
TUBE HYSTEROSCOPY W Y-CONNECT (TUBING) ×3 IMPLANT
WATER STERILE IRR 1000ML POUR (IV SOLUTION) ×3 IMPLANT

## 2013-03-20 NOTE — Progress Notes (Signed)
I have interviewed and performed the pertinent exams on my patient to confirm that there have been no significant changes in her condition since the dictation of her history and physical exam that would have an adverse effect on her surgery.  She has received 2 units of packed RBCs and her pre-op hemoglobin has risen to 7.1 gm.  Her vaginal bleeding has been light to moderate since admission.

## 2013-03-20 NOTE — Anesthesia Preprocedure Evaluation (Signed)
Anesthesia Evaluation  Patient identified by MRN, date of birth, ID band Patient awake    Reviewed: Allergy & Precautions, H&P , NPO status , Patient's Chart, lab work & pertinent test results, reviewed documented beta blocker date and time   History of Anesthesia Complications Negative for: history of anesthetic complications  Airway Mallampati: II      Dental  (+) Poor Dentition, Teeth Intact   Pulmonary shortness of breath and with exertion,  breath sounds clear to auscultation  Pulmonary exam normal       Cardiovascular negative cardio ROS  Rhythm:regular Rate:Normal     Neuro/Psych  Headaches, negative psych ROS   GI/Hepatic Neg liver ROS, GERD-  Medicated,  Endo/Other  BMI 35.6  Renal/GU negative Renal ROS  Female GU complaint     Musculoskeletal   Abdominal   Peds  Hematology  (+) anemia , Admitted last night with hgb 5.0, transfused 2 units, hgb 7.1 this morning Symptomatic anemia since start of last period (02/22/13)   Anesthesia Other Findings   Reproductive/Obstetrics negative OB ROS                           Anesthesia Physical Anesthesia Plan  ASA: II  Anesthesia Plan: General LMA   Post-op Pain Management:    Induction:   Airway Management Planned:   Additional Equipment:   Intra-op Plan:   Post-operative Plan:   Informed Consent: I have reviewed the patients History and Physical, chart, labs and discussed the procedure including the risks, benefits and alternatives for the proposed anesthesia with the patient or authorized representative who has indicated his/her understanding and acceptance.   Dental Advisory Given  Plan Discussed with: CRNA and Surgeon  Anesthesia Plan Comments:         Anesthesia Quick Evaluation

## 2013-03-20 NOTE — Transfer of Care (Signed)
Immediate Anesthesia Transfer of Care Note  Patient: Tamara FlorasRachel Davis Barraclough  Procedure(s) Performed: Procedure(s): DILATATION & CURETTAGE/HYSTEROSCOPY WITH NOVASURE ABLATION (N/A)  Patient Location: PACU  Anesthesia Type:General  Level of Consciousness: awake  Airway & Oxygen Therapy: Patient Spontanous Breathing and Patient connected to nasal cannula oxygen  Post-op Assessment: Report given to PACU RN and Post -op Vital signs reviewed and stable  Post vital signs: stable  Complications: No apparent anesthesia complications

## 2013-03-20 NOTE — Discharge Instructions (Signed)
Hysteroscopy, Care After  Refer to this sheet in the next few weeks. These instructions provide you with information on caring for yourself after your procedure. Your health care provider may also give you more specific instructions. Your treatment has been planned according to current medical practices, but problems sometimes occur. Call your health care provider if you have any problems or questions after your procedure.   WHAT TO EXPECT AFTER THE PROCEDURE  After your procedure, it is typical to have the following:  · You may have some cramping. This normally lasts for a couple days.  · You may have bleeding. This can vary from light spotting for a few days to menstrual-like bleeding for 3 7 days.  HOME CARE INSTRUCTIONS  · Rest for the first 1 2 days after the procedure.  · Only take over-the-counter or prescription medicines as directed by your health care provider. Do not take aspirin. It can increase the chances of bleeding.  · Take showers instead of baths for 2 weeks or as directed by your health care provider.  · Do not drive for 24 hours or as directed.  · Do not drink alcohol while taking pain medicine.  · Do not use tampons, douche, or have sexual intercourse for 2 weeks or until your health care provider says it is okay.  · Take your temperature twice a day for 4 5 days. Write it down each time.  · Follow your health care provider's advice about diet, exercise, and lifting.  · If you develop constipation, you may:  · Take a mild laxative if your health care provider approves.  · Add bran foods to your diet.  · Drink enough fluids to keep your urine clear or pale yellow.  · Try to have someone with you or available to you for the first 24 48 hours, especially if you were given a general anesthetic.  · Follow up with your health care provider as directed.  SEEK MEDICAL CARE IF:  · You feel dizzy or lightheaded.  · You feel sick to your stomach (nauseous).  · You have abnormal vaginal discharge.  · You  have a rash.  · You have pain that is not controlled with medicine.  SEEK IMMEDIATE MEDICAL CARE IF:  · You have bleeding that is heavier than a normal menstrual period.  · You have a fever.  · You have increasing cramps or pain, not controlled with medicine.  · You have new belly (abdominal) pain.  · You pass out.  · You have pain in the tops of your shoulders (shoulder strap areas).  · You have shortness of breath.  Document Released: 10/23/2012 Document Reviewed: 08/01/2012  Exit[employer]® Patient Information ©2014 [employer].

## 2013-03-20 NOTE — Discharge Summary (Signed)
Physician Discharge Summary  Patient ID: Tamara FlorasRachel Davis Fullard MRN: [id number] DOB/AGE: [birth year] 47 y.o.  Admit date: 03/19/2013 Discharge date: 03/20/2013  Admission Diagnoses: Menorrhagia, severe anemia  Discharge Diagnoses: Menorrhagia, severe anemia Active Problems:   Excessive vaginal bleeding   Discharged Condition: stable  Hospital Course: Patient was admitted for observation.  She received 2 units of packed RBCs last night and was brought to the OR this morning for hysteroscopy and Novasure endometrial ablation.  She was discharged from PACU in stable condition.   Consults: None  Significant Diagnostic Studies: labs: CBC  Treatments: surgery: Endometrial ablation.  Blood transfusion.  Discharge Exam: Blood pressure 138/91, pulse 87, temperature 97.8 F (36.6 C), temperature source Oral, resp. rate 16, height 5\' 7"  (1.702 m), weight 102.967 kg (227 lb), last menstrual period 02/22/2013, SpO2 100.00%. Unchanged from admission  Disposition: 01-Home or Self Care     Medication List         acetaminophen 500 MG tablet  Commonly known as:  TYLENOL  Take 1,000 mg by mouth as needed. For pain     aspirin 325 MG tablet  - Take 325 mg by mouth as needed. Pain  -      ferrous gluconate 324 MG tablet  Commonly known as:  FERGON  Take 324 mg by mouth 2 (two) times daily.     HYDROcodone-acetaminophen 5-325 MG per tablet  Commonly known as:  NORCO  Take 1-2 tablets by mouth every 6 (six) hours as needed for moderate pain.     ibuprofen 200 MG tablet  Commonly known as:  ADVIL,MOTRIN  Take 200 mg by mouth every 6 (six) hours as needed for headache.     STOOL SOFTENER PO  Take 1 tablet by mouth daily as needed (constipation).           Follow-up Information   Follow up with Mickel BaasKAPLAN,Anthoney Sheppard D, MD. Schedule an appointment as soon as possible for a visit in 2 weeks.   Specialty:  Obstetrics and Gynecology   Contact information:   [address] [place] [id number] [phone]       Signed: Mickel BaasKAPLAN,Delroy Ordway D 03/20/2013, 11:46 AM

## 2013-03-20 NOTE — Op Note (Signed)
Patient Name: Marylee FlorasRachel Davis Mandigo MRN: [id number]  Date of Surgery: 03/20/2013    PREOPERATIVE DIAGNOSIS: Menorrhagia  POSTOPERATIVE DIAGNOSIS: menorrhagia   PROCEDURE: Hysteroscopy, D&C, Novasure endometrial ablation  SURGEON: Caralyn Guileichard D. Arlyce DiceKaplan M.D.  ANESTHESIA: General  ESTIMATED BLOOD LOSS: Minimal  FINDINGS: Normal uterine cavity sounded to 9 cm.   INDICATIONS: Severe anemia (5.0 gm Hb) and active bleeding.  PROCEDURE IN DETAIL: The patient was taken to the OR and placed in the dors-lithotomy position. The perineum and vagina were prepped and draped in a sterile fashion. The bladder was emptied. Bimanual exam revealed a top normal sized mid position uterus. The cervix and uterus were sounded and the cavity depth was determined to be 5.5 cm. The internal cervical os was dilated with Shawnie PonsPratt dilators to 21 [place]. The diagnostic hysteroscope was introduced and the cavity was inspected. A sharp and suction curettage was performed and the tissue was sent to pathology. The internal os was dilated further to 25 [place] and the Novasure device was inserted and deployed to a width of 4.7 cm. Ablation time was 59 sec. At a power of 136. The hysteroscope was reintroduced and the cavity was intact and well cauterized. The procedure was terminated and the patient left the OR in good condition.

## 2013-03-21 ENCOUNTER — Encounter (HOSPITAL_COMMUNITY): Payer: Self-pay | Admitting: Obstetrics & Gynecology

## 2013-03-21 LAB — CBC
HEMATOCRIT: 23.5 % — AB (ref 36.0–46.0)
Hemoglobin: 7.1 g/dL — ABNORMAL LOW (ref 12.0–15.0)
MCH: 19.6 pg — ABNORMAL LOW (ref 26.0–34.0)
MCHC: 30.2 g/dL (ref 30.0–36.0)
MCV: 64.9 fL — ABNORMAL LOW (ref 78.0–100.0)
PLATELETS: 274 10*3/uL (ref 150–400)
RBC: 3.62 MIL/uL — ABNORMAL LOW (ref 3.87–5.11)
RDW: 26.1 % — AB (ref 11.5–15.5)
WBC: 7.5 10*3/uL (ref 4.0–10.5)

## 2013-03-21 NOTE — Anesthesia Postprocedure Evaluation (Signed)
  Anesthesia Post-op Note  Anesthesia Post Note  Patient: Tamara CheshireRachel Davis Mcmenamin  Procedure(s) Performed: Procedure(s) (LRB): DILATATION & CURETTAGE/HYSTEROSCOPY WITH NOVASURE ABLATION (N/A)  Anesthesia type: General  Patient location: PACU  Post pain: Pain level controlled  Post assessment: Post-op Vital signs reviewed  Last Vitals:  Filed Vitals:   03/20/13 1243  BP: 111/68  Pulse: 90  Temp:   Resp: 18    Post vital signs: Reviewed  Level of consciousness: sedated  Complications: No apparent anesthesia complications

## 2013-03-26 NOTE — Addendum Note (Signed)
Addendum created 03/26/13 1803 by Dana AllanAmy Elara Cocke, MD   Modules edited: Anesthesia Attestations

## 2013-11-17 ENCOUNTER — Encounter (HOSPITAL_COMMUNITY): Payer: Self-pay | Admitting: Obstetrics & Gynecology

## 2015-11-06 ENCOUNTER — Encounter (HOSPITAL_COMMUNITY): Payer: Self-pay | Admitting: *Deleted

## 2015-11-06 ENCOUNTER — Ambulatory Visit (HOSPITAL_COMMUNITY)
Admission: EM | Admit: 2015-11-06 | Discharge: 2015-11-06 | Disposition: A | Discharge status: Nursing Home (Includes ICF) | Payer: BC Managed Care – PPO | Source: Home / Self Care

## 2015-11-06 ENCOUNTER — Inpatient Hospital Stay (HOSPITAL_COMMUNITY)
Admission: EM | Admit: 2015-11-06 | Discharge: 2015-11-09 | DRG: 392 | Disposition: A | Discharge status: Home / Self Care | Payer: BC Managed Care – PPO | Attending: General Surgery | Admitting: General Surgery

## 2015-11-06 ENCOUNTER — Emergency Department (HOSPITAL_COMMUNITY): Payer: BC Managed Care – PPO

## 2015-11-06 DIAGNOSIS — Z833 Family history of diabetes mellitus: Secondary | ICD-10-CM

## 2015-11-06 DIAGNOSIS — K5720 Diverticulitis of large intestine with perforation and abscess without bleeding: Secondary | ICD-10-CM | POA: Diagnosis present

## 2015-11-06 DIAGNOSIS — K5700 Diverticulitis of small intestine with perforation and abscess without bleeding: Secondary | ICD-10-CM

## 2015-11-06 DIAGNOSIS — N888 Other specified noninflammatory disorders of cervix uteri: Secondary | ICD-10-CM | POA: Diagnosis present

## 2015-11-06 DIAGNOSIS — R1031 Right lower quadrant pain: Secondary | ICD-10-CM

## 2015-11-06 DIAGNOSIS — Z8249 Family history of ischemic heart disease and other diseases of the circulatory system: Secondary | ICD-10-CM

## 2015-11-06 DIAGNOSIS — K5732 Diverticulitis of large intestine without perforation or abscess without bleeding: Secondary | ICD-10-CM | POA: Diagnosis present

## 2015-11-06 LAB — URINALYSIS, ROUTINE W REFLEX MICROSCOPIC
GLUCOSE, UA: NEGATIVE mg/dL
HGB URINE DIPSTICK: NEGATIVE
Ketones, ur: 15 mg/dL — AB
Nitrite: NEGATIVE
PH: 5.5 (ref 5.0–8.0)
PROTEIN: NEGATIVE mg/dL
Specific Gravity, Urine: 1.026 (ref 1.005–1.030)

## 2015-11-06 LAB — CBC
HEMATOCRIT: 43.8 % (ref 36.0–46.0)
Hemoglobin: 14.8 g/dL (ref 12.0–15.0)
MCH: 30.1 pg (ref 26.0–34.0)
MCHC: 33.8 g/dL (ref 30.0–36.0)
MCV: 89 fL (ref 78.0–100.0)
PLATELETS: 287 10*3/uL (ref 150–400)
RBC: 4.92 MIL/uL (ref 3.87–5.11)
RDW: 13.3 % (ref 11.5–15.5)
WBC: 12.9 10*3/uL — ABNORMAL HIGH (ref 4.0–10.5)

## 2015-11-06 LAB — COMPREHENSIVE METABOLIC PANEL
ALT: 23 U/L (ref 14–54)
AST: 21 U/L (ref 15–41)
Albumin: 3.7 g/dL (ref 3.5–5.0)
Alkaline Phosphatase: 76 U/L (ref 38–126)
Anion gap: 11 (ref 5–15)
BUN: 8 mg/dL (ref 6–20)
CHLORIDE: 102 mmol/L (ref 101–111)
CO2: 24 mmol/L (ref 22–32)
CREATININE: 0.85 mg/dL (ref 0.44–1.00)
Calcium: 9.2 mg/dL (ref 8.9–10.3)
GFR calc Af Amer: >60 mL/min (ref >60)
GFR calc non Af Amer: >60 mL/min (ref >60)
Glucose, Bld: 97 mg/dL (ref 65–99)
POTASSIUM: 3.6 mmol/L (ref 3.5–5.1)
SODIUM: 137 mmol/L (ref 135–145)
Total Bilirubin: 1.4 mg/dL — ABNORMAL HIGH (ref 0.3–1.2)
Total Protein: 7.2 g/dL (ref 6.5–8.1)

## 2015-11-06 LAB — POCT URINALYSIS DIP (DEVICE)
Glucose, UA: NEGATIVE mg/dL
HGB URINE DIPSTICK: NEGATIVE
Ketones, ur: 80 mg/dL — AB
Leukocytes, UA: NEGATIVE
Nitrite: NEGATIVE
PH: 5.5 (ref 5.0–8.0)
PROTEIN: 30 mg/dL — AB
Urobilinogen, UA: 2 mg/dL — ABNORMAL HIGH (ref 0.0–1.0)

## 2015-11-06 LAB — URINE MICROSCOPIC-ADD ON

## 2015-11-06 LAB — LIPASE, BLOOD: LIPASE: 24 U/L (ref 11–51)

## 2015-11-06 MED ORDER — HYDROMORPHONE HCL 2 MG/ML IJ SOLN
1.0000 mg | INTRAMUSCULAR | Status: DC | PRN
  Administered 2015-11-07 – 2015-11-08 (×2): 1 mg via INTRAVENOUS
  Filled 2015-11-06 (×2): qty 1

## 2015-11-06 MED ORDER — ACETAMINOPHEN 325 MG PO TABS
650.0000 mg | ORAL_TABLET | Freq: Four times a day (QID) | ORAL | Status: DC | PRN
  Administered 2015-11-07 – 2015-11-09 (×4): 650 mg via ORAL
  Filled 2015-11-06 (×4): qty 2

## 2015-11-06 MED ORDER — SODIUM CHLORIDE 0.9 % IV BOLUS (SEPSIS)
1000.0000 mL | Freq: Once | INTRAVENOUS | Status: AC
  Administered 2015-11-06: 1000 mL via INTRAVENOUS

## 2015-11-06 MED ORDER — ONDANSETRON HCL 4 MG/2ML IJ SOLN
4.0000 mg | Freq: Once | INTRAMUSCULAR | Status: AC
  Administered 2015-11-06: 4 mg via INTRAVENOUS
  Filled 2015-11-06: qty 2

## 2015-11-06 MED ORDER — DEXTROSE 5 % IV SOLN
2.0000 g | INTRAVENOUS | Status: DC
  Administered 2015-11-07 – 2015-11-08 (×3): 2 g via INTRAVENOUS
  Filled 2015-11-06 (×4): qty 2

## 2015-11-06 MED ORDER — ENOXAPARIN SODIUM 40 MG/0.4ML ~~LOC~~ SOLN: 40.0000 mg | Freq: Every day | SUBCUTANEOUS | Status: DC

## 2015-11-06 MED ORDER — DIPHENHYDRAMINE HCL 12.5 MG/5ML PO ELIX
12.5000 mg | ORAL_SOLUTION | Freq: Four times a day (QID) | ORAL | Status: DC | PRN
  Administered 2015-11-08: 12.5 mg via ORAL
  Filled 2015-11-06: qty 10

## 2015-11-06 MED ORDER — METHOCARBAMOL 500 MG PO TABS
500.0000 mg | ORAL_TABLET | Freq: Four times a day (QID) | ORAL | Status: DC | PRN
  Administered 2015-11-09: 500 mg via ORAL
  Filled 2015-11-06: qty 1

## 2015-11-06 MED ORDER — ONDANSETRON HCL 4 MG/2ML IJ SOLN
4.0000 mg | Freq: Four times a day (QID) | INTRAMUSCULAR | Status: DC | PRN
  Administered 2015-11-08 – 2015-11-09 (×2): 4 mg via INTRAVENOUS
  Filled 2015-11-06 (×2): qty 2

## 2015-11-06 MED ORDER — SODIUM CHLORIDE 0.9 % IV SOLN
INTRAVENOUS | Status: DC
  Administered 2015-11-07 – 2015-11-08 (×4): via INTRAVENOUS

## 2015-11-06 MED ORDER — ONDANSETRON 4 MG PO TBDP: 4.0000 mg | ORAL_TABLET | Freq: Four times a day (QID) | ORAL | Status: DC | PRN

## 2015-11-06 MED ORDER — SIMETHICONE 80 MG PO CHEW: 40.0000 mg | CHEWABLE_TABLET | Freq: Four times a day (QID) | ORAL | Status: DC | PRN

## 2015-11-06 MED ORDER — IOPAMIDOL (ISOVUE-300) INJECTION 61%
INTRAVENOUS | Status: AC
  Administered 2015-11-06: 100 mL
  Filled 2015-11-06: qty 100

## 2015-11-06 MED ORDER — DIPHENHYDRAMINE HCL 50 MG/ML IJ SOLN: 12.5000 mg | Freq: Four times a day (QID) | INTRAMUSCULAR | Status: DC | PRN

## 2015-11-06 MED ORDER — ACETAMINOPHEN 650 MG RE SUPP: 650.0000 mg | Freq: Four times a day (QID) | RECTAL | Status: DC | PRN

## 2015-11-06 MED ORDER — METRONIDAZOLE IN NACL 5-0.79 MG/ML-% IV SOLN
500.0000 mg | Freq: Once | INTRAVENOUS | Status: AC
  Administered 2015-11-06: 500 mg via INTRAVENOUS
  Filled 2015-11-06: qty 100

## 2015-11-06 MED ORDER — CIPROFLOXACIN IN D5W 400 MG/200ML IV SOLN
400.0000 mg | Freq: Once | INTRAVENOUS | Status: AC
  Administered 2015-11-06: 400 mg via INTRAVENOUS
  Filled 2015-11-06: qty 200

## 2015-11-06 MED ORDER — MORPHINE SULFATE (PF) 4 MG/ML IV SOLN
4.0000 mg | Freq: Once | INTRAVENOUS | Status: AC
  Administered 2015-11-06: 4 mg via INTRAVENOUS
  Filled 2015-11-06: qty 1

## 2015-11-06 MED ORDER — METRONIDAZOLE IN NACL 5-0.79 MG/ML-% IV SOLN
500.0000 mg | Freq: Three times a day (TID) | INTRAVENOUS | Status: DC
  Administered 2015-11-07 – 2015-11-09 (×7): 500 mg via INTRAVENOUS
  Filled 2015-11-06 (×10): qty 100

## 2015-11-06 NOTE — ED Provider Notes (Signed)
CSN: [id number]     Arrival date & time 11/06/15  1206 History   First MD Initiated Contact with Patient 11/06/15 1258     Chief Complaint  Patient presents with  . Abdominal Pain   (Consider location/radiation/quality/duration/timing/severity/associated sxs/prior Treatment) 50 year old female presents with generalize abdominal pain that started with a bad "cramp" 2 days ago. Has continued to get worse and now is more painful in the right lower quadrant. She thought she was constipated and so she took 2 laxatives 2 days ago which caused diarrhea. She continues to have diarrhea yesterday. Denies any fever or vomiting but does feel nauseous. Last fluid and food intake was about 24 hours ago. She has taken Ibuprofen and her daughter's hyrdrocodone with minimal relief.        Past Medical History:  Diagnosis Date  . Anemia   . GERD (gastroesophageal reflux disease)   . Headache(784.0)   . Infection    UTI  . Preterm labor    Past Surgical History:  Procedure Laterality Date  . CESAREAN SECTION    . CHOLECYSTECTOMY  04/26/11  . CHOLECYSTECTOMY  04/26/2011   Procedure: LAPAROSCOPIC CHOLECYSTECTOMY WITH INTRAOPERATIVE CHOLANGIOGRAM;  Surgeon: Currie Parishristian J Streck, MD;  Location: [facility];  Service: General;  Laterality: N/A;  Lap Chole w/IOC  . DILITATION & CURRETTAGE/HYSTROSCOPY WITH NOVASURE ABLATION N/A 03/20/2013   Procedure: DILATATION & CURETTAGE/HYSTEROSCOPY WITH NOVASURE ABLATION;  Surgeon: Mickel Baasichard D Kaplan, MD;  Location: [facility];  Service: Gynecology;  Laterality: N/A;  . TOE SURGERY  2003   remove sewing needle from toe  . vaginal deliveries     Family History  Problem Relation Age of Onset  . Hypertension Mother   . Diabetes Father    Social History  Substance Use Topics  . Smoking status: Never Smoker  . Smokeless tobacco: Never Used  . Alcohol use No   OB History    Gravida Para Term Preterm AB Living   4 3 2 1 1 3    SAB TAB Ectopic Multiple Live Births   1 0 0 0       Review of Systems  Constitutional: Positive for appetite change and fatigue. Negative for fever.  Respiratory: Negative for cough.   Cardiovascular: Negative for chest pain.  Gastrointestinal: Positive for abdominal pain, constipation, diarrhea and nausea. Negative for vomiting.  Genitourinary: Negative for difficulty urinating, dysuria, flank pain, hematuria, vaginal bleeding and vaginal discharge.  Skin: Negative for rash.  Neurological: Negative for dizziness, weakness, numbness and headaches.    Allergies  Review of patient's allergies indicates no known allergies.  Home Medications   Prior to Admission medications   Not on File   Meds Ordered and Administered this Visit  Medications - No data to display  BP 128/96 (BP Location: Left Arm)   Pulse 90   Temp 98.3 F (36.8 C) (Oral)   Resp 18   SpO2 99%  No data found.   Physical Exam  Constitutional: She is oriented to person, place, and time. She appears well-developed and well-nourished. She appears ill.  HENT:  Head: Normocephalic and atraumatic.  Mouth/Throat: Uvula is midline, oropharynx is clear and moist and mucous membranes are normal.  Neck: Normal range of motion. Neck supple.  Cardiovascular: Normal rate, regular rhythm and normal heart sounds.   Pulmonary/Chest: Effort normal and breath sounds normal. She has no wheezes. She has no rales. She exhibits no tenderness.  Abdominal: Soft. Normal appearance and bowel sounds are normal. There is no  hepatosplenomegaly. There is generalized tenderness and tenderness in the right lower quadrant. There is rebound. There is no rigidity, no guarding and no CVA tenderness.  Lymphadenopathy:    She has no cervical adenopathy.  Neurological: She is alert and oriented to person, place, and time.  Skin: Skin is warm and dry. Capillary refill takes less than 2 seconds.  Psychiatric: She has a normal mood and affect. Her behavior is normal. Judgment and thought content  normal.    Urgent Care Course   Clinical Course    Procedures (including critical care time)  Labs Review Labs Reviewed  POCT URINALYSIS DIP (DEVICE) - Abnormal; Notable for the following:       Result Value   Bilirubin Urine MODERATE (*)    Ketones, ur 80 (*)    Protein, ur 30 (*)    Urobilinogen, UA 2.0 (*)    All other components within normal limits    Imaging Review No results found.   Visual Acuity Review  Right Eye Distance:   Left Eye Distance:   Bilateral Distance:    Right Eye Near:   Left Eye Near:    Bilateral Near:         MDM   1. Right lower quadrant abdominal pain    Reviewed urine results with patient- no apparent UTI. Discussed that abdominal pain could be due to numerous causes such as appendicitis, diverticulitis, constipation, etc.. Recommend patient go to ER for further evaluation with blood work and imaging studies. Patient agrees and will go to ER now.     Sudie Grumbling, NP 11/06/15 1356

## 2015-11-06 NOTE — ED Triage Notes (Signed)
Pt reports right side lower abd pain that started on Thursday with episode of cramping. Pt initially thought it was constipation but no relief with laxatives. Denies n/v/d or fever. Went to ucc today and sent here to r/o appendicitis.

## 2015-11-06 NOTE — ED Triage Notes (Signed)
Generalized abd pain has been a constant "underlying ache" with intermittent sharp pains, though does not feel any pain at this moment.  C/O some "pulling sensation" when standing or walking.  Took 2 laxatives and other measures at home 2 days ago to treat constipation.  Yesterday had some diarrhea, but still "feels like I need to have BM".  Unsure if any fevers.  Denies n/v, but c/o decreased appetite.

## 2015-11-06 NOTE — ED Notes (Signed)
Awaiting return of shuttle.

## 2015-11-06 NOTE — Discharge Instructions (Signed)
Go to ER now for further evaluation

## 2015-11-06 NOTE — ED Provider Notes (Signed)
[facility]-EMERGENCY DEPT Provider Note   CSN: [id number] Arrival date & time: 11/06/15  1413     History   Chief Complaint Chief Complaint  Patient presents with  . Abdominal Pain    HPI Tamara Chandler is a 50 y.o. female.  HPI Patient presents from urgent care with diffuse abdominal pain starting 2 days ago. States there's been a constant component with episodic sharp cramps. She's had decreased appetite and oral intake for the last 2 days. Denies fever or chills. She's had nausea without vomiting. Was concerned she may be constipated and took 2 laxatives. She's had several loose bowel movements without blood. No urinary frequency, hesitancy, hematuria or dysuria. No new vaginal symptoms. Past Medical History:  Diagnosis Date  . Anemia   . GERD (gastroesophageal reflux disease)   . Headache(784.0)   . Infection    UTI  . Preterm labor     Patient Active Problem List   Diagnosis Date Noted  . Excessive vaginal bleeding 03/19/2013    Past Surgical History:  Procedure Laterality Date  . CESAREAN SECTION    . CHOLECYSTECTOMY  04/26/11  . CHOLECYSTECTOMY  04/26/2011   Procedure: LAPAROSCOPIC CHOLECYSTECTOMY WITH INTRAOPERATIVE CHOLANGIOGRAM;  Surgeon: Currie Paris, MD;  Location: [facility];  Service: General;  Laterality: N/A;  Lap Chole w/IOC  . DILITATION & CURRETTAGE/HYSTROSCOPY WITH NOVASURE ABLATION N/A 03/20/2013   Procedure: DILATATION & CURETTAGE/HYSTEROSCOPY WITH NOVASURE ABLATION;  Surgeon: Mickel Baas, MD;  Location: [facility];  Service: Gynecology;  Laterality: N/A;  . TOE SURGERY  2003   remove sewing needle from toe  . vaginal deliveries      OB History    Gravida Para Term Preterm AB Living   4 3 2 1 1 3    SAB TAB Ectopic Multiple Live Births   1 0 0 0         Home Medications    Prior to Admission medications   Not on File    Family History Family History  Problem Relation Age of Onset  . Hypertension Mother   . Diabetes Father      Social History Social History  Substance Use Topics  . Smoking status: Never Smoker  . Smokeless tobacco: Never Used  . Alcohol use No     Allergies   Review of patient's allergies indicates no known allergies.   Review of Systems Review of Systems  Constitutional: Positive for appetite change and fatigue. Negative for chills and fever.  Respiratory: Negative for shortness of breath.   Cardiovascular: Negative for chest pain.  Gastrointestinal: Positive for abdominal distention, abdominal pain, diarrhea and nausea. Negative for blood in stool, constipation and vomiting.  Genitourinary: Negative for dysuria, flank pain, frequency, hematuria, vaginal bleeding and vaginal discharge.  Musculoskeletal: Negative for back pain, myalgias, neck pain and neck stiffness.  Skin: Negative for rash and wound.  Neurological: Negative for dizziness, weakness, light-headedness, numbness and headaches.  All other systems reviewed and are negative.    Physical Exam Updated Vital Signs BP 133/76   Pulse 85   Temp 98.5 F (36.9 C) (Oral)   Resp 16   SpO2 100%   Physical Exam  Constitutional: She is oriented to person, place, and time. She appears well-developed and well-nourished. No distress.  HENT:  Head: Normocephalic and atraumatic.  Mouth/Throat: Oropharynx is clear and moist.  Eyes: EOM are normal. Pupils are equal, round, and reactive to light.  Neck: Normal range of motion. Neck supple.  Cardiovascular: Normal rate  and regular rhythm.   Pulmonary/Chest: Effort normal and breath sounds normal.  Abdominal: Soft. She exhibits distension. There is tenderness (diffuse abdominal tenderness to palpation. Appears to be most tender in the right lower quadrant.). There is rebound and guarding.  Patient does have some rebound and guarding in the right lower quadrant. Diminished bowel sounds throughout  Musculoskeletal: Normal range of motion. She exhibits no edema or tenderness.  No CVA  tenderness bilaterally.  Neurological: She is alert and oriented to person, place, and time.  Moves all extremities without deficit. Sensation is fully intact.  Skin: Skin is warm and dry. Capillary refill takes less than 2 seconds. No rash noted. No erythema.  Psychiatric: She has a normal mood and affect. Her behavior is normal.  Nursing note and vitals reviewed.    ED Treatments / Results  Labs (all labs ordered are listed, but only abnormal results are displayed) Labs Reviewed  COMPREHENSIVE METABOLIC PANEL - Abnormal; Notable for the following:       Result Value   Total Bilirubin 1.4 (*)    All other components within normal limits  CBC - Abnormal; Notable for the following:    WBC 12.9 (*)    All other components within normal limits  URINALYSIS, ROUTINE W REFLEX MICROSCOPIC (NOT AT [facility]) - Abnormal; Notable for the following:    Color, Urine ORANGE (*)    APPearance CLOUDY (*)    Bilirubin Urine MODERATE (*)    Ketones, ur 15 (*)    Leukocytes, UA SMALL (*)    All other components within normal limits  URINE MICROSCOPIC-ADD ON - Abnormal; Notable for the following:    Squamous Epithelial / LPF 6-30 (*)    Bacteria, UA FEW (*)    All other components within normal limits  LIPASE, BLOOD  I-STAT BETA HCG BLOOD, ED ([facility], [facility], [facility] ONLY)    EKG  EKG Interpretation None       Radiology Ct Abdomen Pelvis W Contrast  Result Date: 11/06/2015 CLINICAL DATA:  Abdominal pain and constipation for 2 days EXAM: CT ABDOMEN AND PELVIS WITH CONTRAST TECHNIQUE: Multidetector CT imaging of the abdomen and pelvis was performed using the standard protocol following bolus administration of intravenous contrast. CONTRAST:  ISOVUE-300 IOPAMIDOL (ISOVUE-300) INJECTION 61% COMPARISON:  12/23/ 12 FINDINGS: Lower chest: Lung bases are unremarkable.  Small hiatal hernia. Hepatobiliary: No focal liver abnormality is seen. Status post cholecystectomy. No biliary dilatation. Pancreas:  Unremarkable. No pancreatic ductal dilatation or surrounding inflammatory changes. Spleen: The spleen measures 14.5 cm in length. No focal splenic abnormality. Adrenals/Urinary Tract: No adrenal gland mass. Kidneys are symmetrical in size. Bilateral small renal cysts. No hydronephrosis or hydroureter. Bilateral renal there is symmetrical excretion on delayed images. Bilateral visualized proximal ureter is unremarkable. Stomach/Bowel: No gastric outlet obstruction. Mild fluid distended small bowel loops in mid lower abdomen probable mild ileus. No evidence of transition point in caliber of small bowel. No small bowel obstruction. Terminal ileum is unremarkable. There is no pericecal inflammation.  The appendix is not identified. Mild redundant transverse colon. Multiple diverticula are noted descending colon. Multiple diverticula are noted proximal sigmoid colon. Axial image 58 there is mild thickening of diverticular wall and stranding of pericolonic fat in proximal sigmoid colon. Findings are consistent with acute diverticulitis. A tiny diverticular abscess measures about 8 mm. Best seen in coronal image 38. There is no evidence of perforation. No extraluminal air is noted. Vascular/Lymphatic: No aortic aneurysm. No retroperitoneal or mesenteric adenopathy. Reproductive: The  uterus is anteflexed. There is circumferential prominence of uterine cervix and multiple cervical cysts are noted the largest measures 2.6 cm. Follow-up GYN exam and pelvic ultrasound is recommended to assure stability. No adnexal mass. Bilateral ovary is unremarkable. Question bicornuate uterus. Other: No abdominal ascites. Musculoskeletal: No destructive bony lesions are noted. Mild degenerative changes lumbar spine. IMPRESSION: 1. Multiple diverticula are noted proximal sigmoid colon. Axial image 58 there is mild thickening of diverticular wall and stranding of pericolonic fat in proximal sigmoid colon. Findings are consistent with acute  diverticulitis. A tiny diverticular abscess measures about 8 mm. Best seen in coronal image 38. There is no evidence of perforation. No extraluminal air is noted. 2. There is circumferential prominence of uterine cervix and multiple cervical cysts are noted the largest measures 2.6 cm. Follow-up GYN exam and pelvic ultrasound is recommended to assure stability. No adnexal mass. Bilateral ovary is unremarkable. Question bicornuate uterus. 3. Mild fluid distended small bowel loops probable mild ileus. No transition point in caliber of small bowel. 4. No pericecal inflammation.  The appendix is not identified. 5. Mild degenerative changes lumbar spine. These results were called by telephone at the time of interpretation on 11/06/2015 at 7:07 pm to Dr. Loren RacerAVID Parke Jandreau , who verbally acknowledged these results. Electronically Signed   By: Natasha MeadLiviu  Pop M.D.   On: 11/06/2015 19:07    Procedures Procedures (including critical care time)  Medications Ordered in ED Medications  ciprofloxacin (CIPRO) IVPB 400 mg (400 mg Intravenous New Bag/Given 11/06/15 1928)  metroNIDAZOLE (FLAGYL) IVPB 500 mg (500 mg Intravenous New Bag/Given 11/06/15 1928)  sodium chloride 0.9 % bolus 1,000 mL (0 mLs Intravenous Stopped 11/06/15 1749)  morphine 4 MG/ML injection 4 mg (4 mg Intravenous Given 11/06/15 1548)  ondansetron (ZOFRAN) injection 4 mg (4 mg Intravenous Given 11/06/15 1549)  iopamidol (ISOVUE-300) 61 % injection (100 mLs  Contrast Given 11/06/15 1814)     Initial Impression / Assessment and Plan / ED Course  I have reviewed the triage vital signs and the nursing notes.  Pertinent labs & imaging results that were available during my care of the patient were reviewed by me and considered in my medical decision making (see chart for details).  Clinical Course    CT abdomen to rule out surgical cause for the patient's symptoms. Discussed with Dr. Dwain SarnaWakefield who will see the patient in emergency department and  admit. Initiated IV antibiotics for diverticulitis. Also discussed with patient the need to follow-up with OB/GYN regarding cysts seen on cervix. Final Clinical Impressions(s) / ED Diagnoses   Final diagnoses:  Diverticulitis of small intestine with abscess without bleeding  Cervical cyst    New Prescriptions New Prescriptions   No medications on file     Loren Raceravid Abeer Deskins, MD 11/06/15 1949

## 2015-11-06 NOTE — ED Notes (Signed)
[facility] First Nurse.

## 2015-11-06 NOTE — H&P (Signed)
Tamara Chandler is an 50 y.o. female.   Chief Complaint: abd pain HPI: 78 yof who has had 2 days of abdominal pain on the left side. Never had this before.  She has crampy pain.  She has some nausea no emesis.  She has had several loose bms. Voiding fine.  No history of colonoscopy.   Past Medical History:  Diagnosis Date  . Anemia   . GERD (gastroesophageal reflux disease)   . Headache(784.0)   . Infection    UTI  . Preterm labor     Past Surgical History:  Procedure Laterality Date  . CESAREAN SECTION    . CHOLECYSTECTOMY  04/26/11  . CHOLECYSTECTOMY  04/26/2011   Procedure: LAPAROSCOPIC CHOLECYSTECTOMY WITH INTRAOPERATIVE CHOLANGIOGRAM;  Surgeon: Haywood Lasso, MD;  Location: [facility];  Service: General;  Laterality: N/A;  Lap Chole w/IOC  . DILITATION & CURRETTAGE/HYSTROSCOPY WITH NOVASURE ABLATION N/A 03/20/2013   Procedure: DILATATION & CURETTAGE/HYSTEROSCOPY WITH NOVASURE ABLATION;  Surgeon: Sharene Butters, MD;  Location: [facility];  Service: Gynecology;  Laterality: N/A;  . TOE SURGERY  2003   remove sewing needle from toe  . vaginal deliveries      Family History  Problem Relation Age of Onset  . Hypertension Mother   . Diabetes Father    Social History:  reports that she has never smoked. She has never used smokeless tobacco. She reports that she does not drink alcohol or use drugs.  Allergies: No Known Allergies  meds none  Results for orders placed or performed during the hospital encounter of 11/06/15 (from the past 48 hour(s))  Lipase, blood     Status: None   Collection Time: 11/06/15  2:41 PM  Result Value Ref Range   Lipase 24 11 - 51 U/L  Comprehensive metabolic panel     Status: Abnormal   Collection Time: 11/06/15  2:41 PM  Result Value Ref Range   Sodium 137 135 - 145 mmol/L   Potassium 3.6 3.5 - 5.1 mmol/L   Chloride 102 101 - 111 mmol/L   CO2 24 22 - 32 mmol/L   Glucose, Bld 97 65 - 99 mg/dL   BUN 8 6 - 20 mg/dL   Creatinine, Ser 0.85 0.44  - 1.00 mg/dL   Calcium 9.2 8.9 - 10.3 mg/dL   Total Protein 7.2 6.5 - 8.1 g/dL   Albumin 3.7 3.5 - 5.0 g/dL   AST 21 15 - 41 U/L   ALT 23 14 - 54 U/L   Alkaline Phosphatase 76 38 - 126 U/L   Total Bilirubin 1.4 (H) 0.3 - 1.2 mg/dL   GFR calc non Af Amer >60 >60 mL/min   GFR calc Af Amer >60 >60 mL/min    Comment: (NOTE) The eGFR has been calculated using the CKD EPI equation. This calculation has not been validated in all clinical situations. eGFR's persistently <60 mL/min signify possible Chronic Kidney Disease.    Anion gap 11 5 - 15  CBC     Status: Abnormal   Collection Time: 11/06/15  2:41 PM  Result Value Ref Range   WBC 12.9 (H) 4.0 - 10.5 K/uL   RBC 4.92 3.87 - 5.11 MIL/uL   Hemoglobin 14.8 12.0 - 15.0 g/dL   HCT 43.8 36.0 - 46.0 %   MCV 89.0 78.0 - 100.0 fL   MCH 30.1 26.0 - 34.0 pg   MCHC 33.8 30.0 - 36.0 g/dL   RDW 13.3 11.5 - 15.5 %   Platelets  287 150 - 400 K/uL  Urinalysis, Routine w reflex microscopic     Status: Abnormal   Collection Time: 11/06/15  3:06 PM  Result Value Ref Range   Color, Urine ORANGE (A) YELLOW    Comment: BIOCHEMICALS MAY BE AFFECTED BY COLOR   APPearance CLOUDY (A) CLEAR   Specific Gravity, Urine 1.026 1.005 - 1.030   pH 5.5 5.0 - 8.0   Glucose, UA NEGATIVE NEGATIVE mg/dL   Hgb urine dipstick NEGATIVE NEGATIVE   Bilirubin Urine MODERATE (A) NEGATIVE   Ketones, ur 15 (A) NEGATIVE mg/dL   Protein, ur NEGATIVE NEGATIVE mg/dL   Nitrite NEGATIVE NEGATIVE   Leukocytes, UA SMALL (A) NEGATIVE  Urine microscopic-add on     Status: Abnormal   Collection Time: 11/06/15  3:06 PM  Result Value Ref Range   Squamous Epithelial / LPF 6-30 (A) NONE SEEN   WBC, UA 0-5 0 - 5 WBC/hpf   RBC / HPF 0-5 0 - 5 RBC/hpf   Bacteria, UA FEW (A) NONE SEEN   Ct Abdomen Pelvis W Contrast  Result Date: 11/06/2015 CLINICAL DATA:  Abdominal pain and constipation for 2 days EXAM: CT ABDOMEN AND PELVIS WITH CONTRAST TECHNIQUE: Multidetector CT imaging of the  abdomen and pelvis was performed using the standard protocol following bolus administration of intravenous contrast. CONTRAST:  17m ISOVUE-300 IOPAMIDOL (ISOVUE-300) INJECTION 61% COMPARISON:  12/23/ 12 FINDINGS: Lower chest: Lung bases are unremarkable.  Small hiatal hernia. Hepatobiliary: No focal liver abnormality is seen. Status post cholecystectomy. No biliary dilatation. Pancreas: Unremarkable. No pancreatic ductal dilatation or surrounding inflammatory changes. Spleen: The spleen measures 14.5 cm in length. No focal splenic abnormality. Adrenals/Urinary Tract: No adrenal gland mass. Kidneys are symmetrical in size. Bilateral small renal cysts. No hydronephrosis or hydroureter. Bilateral renal there is symmetrical excretion on delayed images. Bilateral visualized proximal ureter is unremarkable. Stomach/Bowel: No gastric outlet obstruction. Mild fluid distended small bowel loops in mid lower abdomen probable mild ileus. No evidence of transition point in caliber of small bowel. No small bowel obstruction. Terminal ileum is unremarkable. There is no pericecal inflammation.  The appendix is not identified. Mild redundant transverse colon. Multiple diverticula are noted descending colon. Multiple diverticula are noted proximal sigmoid colon. Axial image 58 there is mild thickening of diverticular wall and stranding of pericolonic fat in proximal sigmoid colon. Findings are consistent with acute diverticulitis. A tiny diverticular abscess measures about 8 mm. Best seen in coronal image 38. There is no evidence of perforation. No extraluminal air is noted. Vascular/Lymphatic: No aortic aneurysm. No retroperitoneal or mesenteric adenopathy. Reproductive: The uterus is anteflexed. There is circumferential prominence of uterine cervix and multiple cervical cysts are noted the largest measures 2.6 cm. Follow-up GYN exam and pelvic ultrasound is recommended to assure stability. No adnexal mass. Bilateral ovary is  unremarkable. Question bicornuate uterus. Other: No abdominal ascites. Musculoskeletal: No destructive bony lesions are noted. Mild degenerative changes lumbar spine. IMPRESSION: 1. Multiple diverticula are noted proximal sigmoid colon. Axial image 58 there is mild thickening of diverticular wall and stranding of pericolonic fat in proximal sigmoid colon. Findings are consistent with acute diverticulitis. A tiny diverticular abscess measures about 8 mm. Best seen in coronal image 38. There is no evidence of perforation. No extraluminal air is noted. 2. There is circumferential prominence of uterine cervix and multiple cervical cysts are noted the largest measures 2.6 cm. Follow-up GYN exam and pelvic ultrasound is recommended to assure stability. No adnexal mass. Bilateral ovary is unremarkable. Question  bicornuate uterus. 3. Mild fluid distended small bowel loops probable mild ileus. No transition point in caliber of small bowel. 4. No pericecal inflammation.  The appendix is not identified. 5. Mild degenerative changes lumbar spine. These results were called by telephone at the time of interpretation on 11/06/2015 at 7:07 pm to Dr. Julianne Rice , who verbally acknowledged these results. Electronically Signed   By: Lahoma Crocker M.D.   On: 11/06/2015 19:07    Review of Systems  Constitutional: Negative for chills and fever.  Respiratory: Negative for cough.   Cardiovascular: Negative for chest pain.  Gastrointestinal: Positive for abdominal pain, diarrhea and nausea. Negative for constipation and vomiting.    Blood pressure 116/64, pulse 87, temperature 98.5 F (36.9 C), temperature source Oral, resp. rate 16, SpO2 99 %. Physical Exam  Vitals reviewed. Constitutional: She is oriented to person, place, and time. She appears well-developed and well-nourished.  HENT:  Head: Normocephalic and atraumatic.  Eyes: No scleral icterus.  Neck: Neck supple.  Cardiovascular: Normal rate, regular rhythm,  normal heart sounds and intact distal pulses.   Respiratory: Effort normal and breath sounds normal.  GI: Soft. She exhibits no distension. There is tenderness in the left upper quadrant and left lower quadrant. No hernia.  Musculoskeletal: Normal range of motion.  Neurological: She is alert and oriented to person, place, and time.     Assessment/Plan Diverticulitis  She does not need surgery. I think this should heal with abx.  Will admit for iv abx, npo.  Role of surgery is for failure or worsening.  Questions answered  Rolm Bookbinder, MD 11/06/2015, 8:09 PM

## 2015-11-07 LAB — CBC
HEMATOCRIT: 38.6 % (ref 36.0–46.0)
HEMOGLOBIN: 12.7 g/dL (ref 12.0–15.0)
MCH: 29.3 pg (ref 26.0–34.0)
MCHC: 32.9 g/dL (ref 30.0–36.0)
MCV: 89.1 fL (ref 78.0–100.0)
Platelets: 243 10*3/uL (ref 150–400)
RBC: 4.33 MIL/uL (ref 3.87–5.11)
RDW: 13.3 % (ref 11.5–15.5)
WBC: 7.8 10*3/uL (ref 4.0–10.5)

## 2015-11-07 LAB — BASIC METABOLIC PANEL
ANION GAP: 9 (ref 5–15)
BUN: 9 mg/dL (ref 6–20)
CALCIUM: 8.4 mg/dL — AB (ref 8.9–10.3)
CO2: 26 mmol/L (ref 22–32)
Chloride: 104 mmol/L (ref 101–111)
Creatinine, Ser: 0.82 mg/dL (ref 0.44–1.00)
GFR calc Af Amer: >60 mL/min (ref >60)
GLUCOSE: 99 mg/dL (ref 65–99)
POTASSIUM: 3.7 mmol/L (ref 3.5–5.1)
Sodium: 139 mmol/L (ref 135–145)

## 2015-11-07 NOTE — Progress Notes (Signed)
Subjective: Feels better, Abdominal pain less  Objective: Vital signs in last 24 hours: Temp:  [98 F (36.7 C)-98.5 F (36.9 C)] 98.3 F (36.8 C) (10/22 1191) Pulse Rate:  [81-97] 82 (10/22 0638) Resp:  [16-18] 17 (10/22 0638) BP: (112-138)/(63-98) 118/78 (10/22 4782) SpO2:  [94 %-100 %] 95 % (10/22 9562) Weight:  [113.4 kg (250 lb)] 113.4 kg (250 lb) (10/21 2304) Last BM Date: 11/05/15  Intake/Output from previous day: 10/21 0701 - 10/22 0700 In: 1750 [I.V.:400; IV Piggyback:1350] Out: -  Intake/Output this shift: No intake/output data recorded.  Exam: Looks comfortable Abdomen soft with mild LLQ tenderness  Lab Results:   Recent Labs  11/06/15 1441 11/07/15 0342  WBC 12.9* 7.8  HGB 14.8 12.7  HCT 43.8 38.6  PLT 287 243   BMET  Recent Labs  11/06/15 1441 11/07/15 0342  NA 137 139  K 3.6 3.7  CL 102 104  CO2 24 26  GLUCOSE 97 99  BUN 8 9  CREATININE 0.85 0.82  CALCIUM 9.2 8.4*   PT/INR No results for input(s): LABPROT, INR in the last 72 hours. ABG No results for input(s): PHART, HCO3 in the last 72 hours.  Invalid input(s): PCO2, PO2  Studies/Results: Ct Abdomen Pelvis W Contrast  Result Date: 11/06/2015 CLINICAL DATA:  Abdominal pain and constipation for 2 days EXAM: CT ABDOMEN AND PELVIS WITH CONTRAST TECHNIQUE: Multidetector CT imaging of the abdomen and pelvis was performed using the standard protocol following bolus administration of intravenous contrast. CONTRAST:  ISOVUE-300 IOPAMIDOL (ISOVUE-300) INJECTION 61% COMPARISON:  12/23/ 12 FINDINGS: Lower chest: Lung bases are unremarkable.  Small hiatal hernia. Hepatobiliary: No focal liver abnormality is seen. Status post cholecystectomy. No biliary dilatation. Pancreas: Unremarkable. No pancreatic ductal dilatation or surrounding inflammatory changes. Spleen: The spleen measures 14.5 cm in length. No focal splenic abnormality. Adrenals/Urinary Tract: No adrenal gland mass. Kidneys are  symmetrical in size. Bilateral small renal cysts. No hydronephrosis or hydroureter. Bilateral renal there is symmetrical excretion on delayed images. Bilateral visualized proximal ureter is unremarkable. Stomach/Bowel: No gastric outlet obstruction. Mild fluid distended small bowel loops in mid lower abdomen probable mild ileus. No evidence of transition point in caliber of small bowel. No small bowel obstruction. Terminal ileum is unremarkable. There is no pericecal inflammation.  The appendix is not identified. Mild redundant transverse colon. Multiple diverticula are noted descending colon. Multiple diverticula are noted proximal sigmoid colon. Axial image 58 there is mild thickening of diverticular wall and stranding of pericolonic fat in proximal sigmoid colon. Findings are consistent with acute diverticulitis. A tiny diverticular abscess measures about 8 mm. Best seen in coronal image 38. There is no evidence of perforation. No extraluminal air is noted. Vascular/Lymphatic: No aortic aneurysm. No retroperitoneal or mesenteric adenopathy. Reproductive: The uterus is anteflexed. There is circumferential prominence of uterine cervix and multiple cervical cysts are noted the largest measures 2.6 cm. Follow-up GYN exam and pelvic ultrasound is recommended to assure stability. No adnexal mass. Bilateral ovary is unremarkable. Question bicornuate uterus. Other: No abdominal ascites. Musculoskeletal: No destructive bony lesions are noted. Mild degenerative changes lumbar spine. IMPRESSION: 1. Multiple diverticula are noted proximal sigmoid colon. Axial image 58 there is mild thickening of diverticular wall and stranding of pericolonic fat in proximal sigmoid colon. Findings are consistent with acute diverticulitis. A tiny diverticular abscess measures about 8 mm. Best seen in coronal image 38. There is no evidence of perforation. No extraluminal air is noted. 2. There is circumferential prominence of uterine  cervix  and multiple cervical cysts are noted the largest measures 2.6 cm. Follow-up GYN exam and pelvic ultrasound is recommended to assure stability. No adnexal mass. Bilateral ovary is unremarkable. Question bicornuate uterus. 3. Mild fluid distended small bowel loops probable mild ileus. No transition point in caliber of small bowel. 4. No pericecal inflammation.  The appendix is not identified. 5. Mild degenerative changes lumbar spine. These results were called by telephone at the time of interpretation on 11/06/2015 at 7:07 pm to Dr. Loren RacerAVID YELVERTON , who verbally acknowledged these results. Electronically Signed   By: Natasha MeadLiviu  Pop M.D.   On: 11/06/2015 19:07    Anti-infectives: Anti-infectives    Start     Dose/Rate Route Frequency Ordered Stop   11/07/15 0600  metroNIDAZOLE (FLAGYL) IVPB 500 mg     500 mg 100 mL/hr over 60 Minutes Intravenous Every 8 hours 11/06/15 2252     11/06/15 2252  cefTRIAXone (ROCEPHIN) 2 g in dextrose 5 % 50 mL IVPB     2 g 100 mL/hr over 30 Minutes Intravenous Every 24 hours 11/06/15 2252     11/06/15 1915  ciprofloxacin (CIPRO) IVPB 400 mg     400 mg 200 mL/hr over 60 Minutes Intravenous  Once 11/06/15 1907 11/06/15 2028   11/06/15 1915  metroNIDAZOLE (FLAGYL) IVPB 500 mg     500 mg 100 mL/hr over 60 Minutes Intravenous  Once 11/06/15 1907 11/06/15 2028      Assessment/Plan:  Diverticulitis  Improved today with less pain and decreased WBC  Continue IV antibiotics Clear liquid diet  LOS: 1 day    Tamara Chandler A 11/07/2015

## 2015-11-08 LAB — I-STAT BETA HCG BLOOD, ED (NOT ORDERABLE): I-stat hCG, quantitative: <5 m[IU]/mL (ref <5)

## 2015-11-08 MED ORDER — OXYCODONE-ACETAMINOPHEN 5-325 MG PO TABS
1.0000 | ORAL_TABLET | ORAL | Status: DC | PRN
  Administered 2015-11-08 – 2015-11-09 (×3): 1 via ORAL
  Filled 2015-11-08 (×4): qty 1

## 2015-11-08 MED ORDER — HYDROMORPHONE HCL 2 MG/ML IJ SOLN
0.5000 mg | INTRAMUSCULAR | Status: DC | PRN
  Administered 2015-11-09: 1 mg via INTRAVENOUS
  Filled 2015-11-08: qty 1

## 2015-11-08 MED ORDER — HYDROMORPHONE HCL 2 MG/ML IJ SOLN: 0.5000 mg | INTRAMUSCULAR | Status: DC | PRN

## 2015-11-08 NOTE — Progress Notes (Signed)
Subjective: She felt pretty good yesterday had more pain and nausea last p.m. and is a bit discouraged. Her pain is better, he says her abdomen feels heavy now but not the pain she had when she came in. She has good bowel sounds and is having bowel movements.  Objective: Vital signs in last 24 hours: Temp:  [97.6 F (36.4 C)-98.5 F (36.9 C)] 98.5 F (36.9 C) (10/23 0520) Pulse Rate:  [72-80] 76 (10/23 0520) Resp:  [17-18] 18 (10/23 0520) BP: (124-143)/(62-81) 124/62 (10/23 0520) SpO2:  [94 %-100 %] 94 % (10/23 0520) Last BM Date: 11/07/15 960 PO clears Urine 1900 Afebrile, VSS WBC improving, BMP OK CT scan 11/06/15   Intake/Output from previous day: 10/22 0701 - 10/23 0700 In: 960 [P.O.:960] Out: 1900 [Urine:1900] Intake/Output this shift: No intake/output data recorded.  General appearance: alert, cooperative, no distress  Resp: alert, cooperative, no distress and Anxious ZO:[id number]:Soft, up in the chair but does not appear distended. Positive bowel sounds nontender to palpation. Chest:  clear  Lab Results:   Recent Labs  11/06/15 1441 11/07/15 0342  WBC 12.9* 7.8  HGB 14.8 12.7  HCT 43.8 38.6  PLT 287 243    BMET  Recent Labs  11/06/15 1441 11/07/15 0342  NA 137 139  K 3.6 3.7  CL 102 104  CO2 24 26  GLUCOSE 97 99  BUN 8 9  CREATININE 0.85 0.82  CALCIUM 9.2 8.4*   PT/INR No results for input(s): LABPROT, INR in the last 72 hours.   Recent Labs Lab 11/06/15 1441  AST 21  ALT 23  ALKPHOS 76  BILITOT 1.4*  PROT 7.2  ALBUMIN 3.7     Lipase     Component Value Date/Time   LIPASE 24 11/06/2015 1441     Studies/Results: Ct Abdomen Pelvis W Contrast  Result Date: 11/06/2015 CLINICAL DATA:  Abdominal pain and constipation for 2 days EXAM: CT ABDOMEN AND PELVIS WITH CONTRAST TECHNIQUE: Multidetector CT imaging of the abdomen and pelvis was performed using the standard protocol following bolus administration of intravenous contrast. CONTRAST:   100mL ISOVUE-300 IOPAMIDOL (ISOVUE-300) INJECTION 61% COMPARISON:  12/23/ 12 FINDINGS: Lower chest: Lung bases are unremarkable.  Small hiatal hernia. Hepatobiliary: No focal liver abnormality is seen. Status post cholecystectomy. No biliary dilatation. Pancreas: Unremarkable. No pancreatic ductal dilatation or surrounding inflammatory changes. Spleen: The spleen measures 14.5 cm in length. No focal splenic abnormality. Adrenals/Urinary Tract: No adrenal gland mass. Kidneys are symmetrical in size. Bilateral small renal cysts. No hydronephrosis or hydroureter. Bilateral renal there is symmetrical excretion on delayed images. Bilateral visualized proximal ureter is unremarkable. Stomach/Bowel: No gastric outlet obstruction. Mild fluid distended small bowel loops in mid lower abdomen probable mild ileus. No evidence of transition point in caliber of small bowel. No small bowel obstruction. Terminal ileum is unremarkable. There is no pericecal inflammation.  The appendix is not identified. Mild redundant transverse colon. Multiple diverticula are noted descending colon. Multiple diverticula are noted proximal sigmoid colon. Axial image 58 there is mild thickening of diverticular wall and stranding of pericolonic fat in proximal sigmoid colon. Findings are consistent with acute diverticulitis. A tiny diverticular abscess measures about 8 mm. Best seen in coronal image 38. There is no evidence of perforation. No extraluminal air is noted. Vascular/Lymphatic: No aortic aneurysm. No retroperitoneal or mesenteric adenopathy. Reproductive: The uterus is anteflexed. There is circumferential prominence of uterine cervix and multiple cervical cysts are noted the largest measures 2.6 cm. Follow-up GYN exam and pelvic  ultrasound is recommended to assure stability. No adnexal mass. Bilateral ovary is unremarkable. Question bicornuate uterus. Other: No abdominal ascites. Musculoskeletal: No destructive bony lesions are noted. Mild  degenerative changes lumbar spine. IMPRESSION: 1. Multiple diverticula are noted proximal sigmoid colon. Axial image 58 there is mild thickening of diverticular wall and stranding of pericolonic fat in proximal sigmoid colon. Findings are consistent with acute diverticulitis. A tiny diverticular abscess measures about 8 mm. Best seen in coronal image 38. There is no evidence of perforation. No extraluminal air is noted. 2. There is circumferential prominence of uterine cervix and multiple cervical cysts are noted the largest measures 2.6 cm. Follow-up GYN exam and pelvic ultrasound is recommended to assure stability. No adnexal mass. Bilateral ovary is unremarkable. Question bicornuate uterus. 3. Mild fluid distended small bowel loops probable mild ileus. No transition point in caliber of small bowel. 4. No pericecal inflammation.  The appendix is not identified. 5. Mild degenerative changes lumbar spine. These results were called by telephone at the time of interpretation on 11/06/2015 at 7:07 pm to Dr. Loren Racer , who verbally acknowledged these results. Electronically Signed   By: Natasha Mead M.D.   On: 11/06/2015 19:07   Prior to Admission medications   Not on File    Medications: . cefTRIAXone (ROCEPHIN)  IV  2 g Intravenous Q24H   And  . metronidazole  500 mg Intravenous Q8H  . enoxaparin (LOVENOX) injection  40 mg Subcutaneous QHS   . sodium chloride 100 mL/hr at 11/08/15 1610    Assessment/Plan Diverticulitis FEN:  Clear/IV fluids ID: day 3 Rocephin/Flagyl DVT:  Lovenox  Plan: Advance diet, continue antibiotics, recheck labs in a.m. Decrease IV fluids. Increase ambulation.  LOS: 2 days    Tamara Chandler 11/08/2015 [phone]

## 2015-11-09 ENCOUNTER — Encounter: Payer: Self-pay | Admitting: General Surgery

## 2015-11-09 LAB — CBC
HCT: 38.7 % (ref 36.0–46.0)
HEMOGLOBIN: 12.7 g/dL (ref 12.0–15.0)
MCH: 29.6 pg (ref 26.0–34.0)
MCHC: 32.8 g/dL (ref 30.0–36.0)
MCV: 90.2 fL (ref 78.0–100.0)
PLATELETS: 232 10*3/uL (ref 150–400)
RBC: 4.29 MIL/uL (ref 3.87–5.11)
RDW: 13.6 % (ref 11.5–15.5)
WBC: 5.9 10*3/uL (ref 4.0–10.5)

## 2015-11-09 LAB — BASIC METABOLIC PANEL
ANION GAP: 11 (ref 5–15)
BUN: 5 mg/dL — ABNORMAL LOW (ref 6–20)
CHLORIDE: 105 mmol/L (ref 101–111)
CO2: 22 mmol/L (ref 22–32)
Calcium: 8.2 mg/dL — ABNORMAL LOW (ref 8.9–10.3)
Creatinine, Ser: 0.75 mg/dL (ref 0.44–1.00)
GFR calc Af Amer: >60 mL/min (ref >60)
Glucose, Bld: 95 mg/dL (ref 65–99)
POTASSIUM: 4.4 mmol/L (ref 3.5–5.1)
SODIUM: 138 mmol/L (ref 135–145)

## 2015-11-09 MED ORDER — SACCHAROMYCES BOULARDII 250 MG PO CAPS: ORAL_CAPSULE | ORAL | Status: AC

## 2015-11-09 MED ORDER — SACCHAROMYCES BOULARDII 250 MG PO CAPS
250.0000 mg | ORAL_CAPSULE | Freq: Two times a day (BID) | ORAL | Status: DC
  Administered 2015-11-09: 250 mg via ORAL
  Filled 2015-11-09: qty 1

## 2015-11-09 MED ORDER — ACETAMINOPHEN 325 MG PO TABS: 650.0000 mg | ORAL_TABLET | Freq: Four times a day (QID) | ORAL | Status: AC | PRN

## 2015-11-09 MED ORDER — ONDANSETRON 4 MG PO TBDP: 4.0000 mg | ORAL_TABLET | Freq: Four times a day (QID) | ORAL | 0 refills | Status: AC | PRN

## 2015-11-09 MED ORDER — METRONIDAZOLE 500 MG PO TABS
500.0000 mg | ORAL_TABLET | Freq: Three times a day (TID) | ORAL | Status: DC
  Administered 2015-11-09: 500 mg via ORAL
  Filled 2015-11-09: qty 1

## 2015-11-09 MED ORDER — OXYCODONE-ACETAMINOPHEN 5-325 MG PO TABS: 1.0000 | ORAL_TABLET | ORAL | 0 refills | Status: AC | PRN

## 2015-11-09 MED ORDER — CIPROFLOXACIN HCL 500 MG PO TABS: 500.0000 mg | ORAL_TABLET | Freq: Two times a day (BID) | ORAL | 1 refills | Status: AC

## 2015-11-09 MED ORDER — METRONIDAZOLE 500 MG PO TABS: 500.0000 mg | ORAL_TABLET | Freq: Three times a day (TID) | ORAL | 1 refills | Status: AC

## 2015-11-09 MED ORDER — CIPROFLOXACIN HCL 500 MG PO TABS: 500.0000 mg | ORAL_TABLET | Freq: Two times a day (BID) | ORAL | Status: DC

## 2015-11-09 NOTE — Discharge Instructions (Signed)
Diverticulitis Diverticulitis is inflammation or infection of small pouches in your colon that form when you have a condition called diverticulosis. The pouches in your colon are called diverticula. Your colon, or large intestine, is where water is absorbed and stool is formed. Complications of diverticulitis can include:  Bleeding.  Severe infection.  Severe pain.  Perforation of your colon.  Obstruction of your colon. CAUSES  Diverticulitis is caused by bacteria. Diverticulitis happens when stool becomes trapped in diverticula. This allows bacteria to grow in the diverticula, which can lead to inflammation and infection. RISK FACTORS People with diverticulosis are at risk for diverticulitis. Eating a diet that does not include enough fiber from fruits and vegetables may make diverticulitis more likely to develop. SYMPTOMS  Symptoms of diverticulitis may include:  Abdominal pain and tenderness. The pain is normally located on the left side of the abdomen, but may occur in other areas.  Fever and chills.  Bloating.  Cramping.  Nausea.  Vomiting.  Constipation.  Diarrhea.  Blood in your stool. DIAGNOSIS  Your health care provider will ask you about your medical history and do a physical exam. You may need to have tests done because many medical conditions can cause the same symptoms as diverticulitis. Tests may include:  Blood tests.  Urine tests.  Imaging tests of the abdomen, including X-rays and CT scans. When your condition is under control, your health care provider may recommend that you have a colonoscopy. A colonoscopy can show how severe your diverticula are and whether something else is causing your symptoms. TREATMENT  Most cases of diverticulitis are mild and can be treated at home. Treatment may include:  Taking over-the-counter pain medicines.  Following a clear liquid diet.  Taking antibiotic medicines by mouth for 7-10 days. More severe cases may  be treated at a hospital. Treatment may include:  Not eating or drinking.  Taking prescription pain medicine.  Receiving antibiotic medicines through an IV tube.  Receiving fluids and nutrition through an IV tube.  Surgery. HOME CARE INSTRUCTIONS   Follow your health care provider's instructions carefully.  Follow a full liquid diet or other diet as directed by your health care provider. After your symptoms improve, your health care provider may tell you to change your diet. He or she may recommend you eat a high-fiber diet. Fruits and vegetables are good sources of fiber. Fiber makes it easier to pass stool.  Take fiber supplements or probiotics as directed by your health care provider.  Only take medicines as directed by your health care provider.  Keep all your follow-up appointments. SEEK MEDICAL CARE IF:   Your pain does not improve.  You have a hard time eating food.  Your bowel movements do not return to normal. SEEK IMMEDIATE MEDICAL CARE IF:   Your pain becomes worse.  Your symptoms do not get better.  Your symptoms suddenly get worse.  You have a fever.  You have repeated vomiting.  You have bloody or black, tarry stools. MAKE SURE YOU:   Understand these instructions.  Will watch your condition.  Will get help right away if you are not doing well or get worse.   This information is not intended to replace advice given to you by your health care provider. Make sure you discuss any questions you have with your health care provider.   Document Released: 10/12/2004 Document Revised: 01/07/2013 Document Reviewed: 11/27/2012 Elsevier Interactive Patient Education 2016 [employer]   FOLLOW UP WITH PRIMARY CARE AND GASTROENTEROLOGIST  WITH COLONOSCOPY IN 6-7 WEEKS.

## 2015-11-09 NOTE — Progress Notes (Signed)
  Subjective: She did well yesterday with full liquids. She was able to ambulate without any significant pain yesterday. She had pain, nausea, and became fairly anxious early this a.m. around 4. She took some Zofran and additional pain medications at that time. Her stools are looser now, and she is very anxious about going home and feeling worse.  Objective: Vital signs in last 24 hours: Temp:  [98.1 F (36.7 C)-98.5 F (36.9 C)] 98.3 F (36.8 C) (10/24 0544) Pulse Rate:  [69-78] 74 (10/24 0544) Resp:  [18] 18 (10/24 0544) BP: (123-146)/(75-88) 123/75 (10/24 0544) SpO2:  [96 %-98 %] 96 % (10/24 0544) Last BM Date: 11/08/15 1440 PO Urine 2025 BM x 1 recorded Afebrile, VSS BMP OK, I don't see CBC No film  Intake/Output from previous day: 10/23 0701 - 10/24 0700 In: 3090 [P.O.:1440; I.V.:1400; IV Piggyback:250] Out: 2025 [Urine:2025] Intake/Output this shift: No intake/output data recorded.  General appearance: alert, cooperative and no distress Resp: clear to auscultation bilaterally GI: soft, non-tender; bowel sounds normal; no masses,  no organomegaly  Lab Results:   Recent Labs  11/06/15 1441 11/07/15 0342  WBC 12.9* 7.8  HGB 14.8 12.7  HCT 43.8 38.6  PLT 287 243    BMET  Recent Labs  11/07/15 0342 11/09/15 0549  NA 139 138  K 3.7 4.4  CL 104 105  CO2 26 22  GLUCOSE 99 95  BUN 9 5*  CREATININE 0.82 0.75  CALCIUM 8.4* 8.2*   PT/INR No results for input(s): LABPROT, INR in the last 72 hours.   Recent Labs Lab 11/06/15 1441  AST 21  ALT 23  ALKPHOS 76  BILITOT 1.4*  PROT 7.2  ALBUMIN 3.7     Lipase     Component Value Date/Time   LIPASE 24 11/06/2015 1441     Studies/Results: No results found.  Medications: . cefTRIAXone (ROCEPHIN)  IV  2 g Intravenous Q24H   And  . metronidazole  500 mg Intravenous Q8H  . enoxaparin (LOVENOX) injection  40 mg Subcutaneous QHS    Assessment/Plan Diverticulitis FEN:  full liquids/IV fluids  -  soft diet this AM ID: day 4 Rocephin/Flagyl - change Flagyl to PO and decide on PO Cipro vs Cephalosporin  DVT:  Lovenox   Plan:  Advance diet, saline lock IV, discuss dc timing and antibiotics with Dr. Lindie SpruceWyatt.  CBC in AM, add probiotic    LOS: 3 days    Tamara Chandler 11/09/2015 [phone]

## 2015-11-09 NOTE — Discharge Summary (Signed)
Physician Discharge Summary  Patient ID: Tamara Chandler MRN: [id number] DOB/AGE: 1966-01-11 50 y.o.   PCP: No PCP Per Patient   Admit date: 11/06/2015 Discharge date: 11/09/2015  Admission Diagnoses:  Diverticulitis  Discharge Diagnoses:  Same Active Problems:   Diverticulitis large intestine   PROCEDURES: None  Hospital Course:  64 yof who has had 2 days of abdominal pain on the left side. Never had this before.  She has crampy pain.  She has some nausea no emesis.  She has had several loose bms. Voiding fine.  No history of colonoscopy.  She was Seen and admitted by Dr. Emelia Loron. She was started on IV antibiotics. She made good progress over the next 72 hours. She was having some problems with discomfort and nausea in the early a.m. relieved by Zofran and by mouth pain medications. By 11/09/2015 her white count was down to 5.9 other labs were also normal. She was tolerating a soft diet. It was Dr. Dixon Boos opinion she was ready for discharge. We plan to treat her with 7 more days of Cipro and Flagyl. She does not have a primary care physician. We recommended she obtain primary care and follow-up with them. She will also need a GI evaluation with colonoscopy in the future.  CBC Latest Ref Rng & Units 11/09/2015 11/07/2015 11/06/2015  WBC 4.0 - 10.5 K/uL 5.9 7.8 12.9(H)  Hemoglobin 12.0 - 15.0 g/dL 40.9 81.1 91.4  Hematocrit 36.0 - 46.0 % 38.7 38.6 43.8  Platelets 150 - 400 K/uL 232 243 287   CMP Latest Ref Rng & Units 11/09/2015 11/07/2015 11/06/2015  Glucose 65 - 99 mg/dL 95 99 97  BUN 6 - 20 mg/dL 5(L) 9 8  Creatinine 7.82 - 1.00 mg/dL 9.56 2.13 0.86  Sodium 135 - 145 mmol/L 138 139 137  Potassium 3.5 - 5.1 mmol/L 4.4 3.7 3.6  Chloride 101 - 111 mmol/L 105 104 102  CO2 22 - 32 mmol/L 22 26 24   Calcium 8.9 - 10.3 mg/dL 8.2(L) 8.4(L) 9.2  Total Protein 6.5 - 8.1 g/dL - - 7.2  Total Bilirubin 0.3 - 1.2 mg/dL - - 1.4(H)  Alkaline Phos 38 - 126 U/L - - 76  AST 15  - 41 U/L - - 21  ALT 14 - 54 U/L - - 23     Condition on discharge: Improving  Disposition: Home care     Medication List    TAKE these medications   acetaminophen 325 MG tablet Commonly known as:  TYLENOL Take 2 tablets (650 mg total) by mouth every 6 (six) hours as needed for mild pain (or temp > 100).   ciprofloxacin 500 MG tablet Commonly known as:  CIPRO Take 1 tablet (500 mg total) by mouth 2 (two) times daily.   metroNIDAZOLE 500 MG tablet Commonly known as:  FLAGYL Take 1 tablet (500 mg total) by mouth every 8 (eight) hours.   ondansetron 4 MG disintegrating tablet Commonly known as:  ZOFRAN-ODT Take 1 tablet (4 mg total) by mouth every 6 (six) hours as needed for nausea.   oxyCODONE-acetaminophen 5-325 MG tablet Commonly known as:  PERCOCET/ROXICET Take 1-2 tablets by mouth every 4 (four) hours as needed for moderate pain.   saccharomyces boulardii 250 MG capsule Commonly known as:  FLORASTOR YOU CAN BUY THIS OVER THE COUNTER AND TAKE AS DIRECTED ON PACKAGE FOR AT LEAST A WEEK AFTER YOU FINISH ANTIBIOTICS.      Follow-up Information    OBTAIN A PRIMARY CARE DOCTOR .  Contact information: Follow up in 1-2 weeks.       Jimmye NormanJAMES WYATT, MD .   Specialty:  General Surgery Why:  You do not need to follow up with our office since we did not operate on you.  You need to see your Primary care doctor and follow up.  You need to see a gastroenterologist for colonoscopy in 6-7 weeks. Contact information: [address] [place] [address] [phone]           Signed: Sherrie GeorgeJENNINGS,Rashaun Wichert 11/09/2015, 2:49 PM

## 2015-11-09 NOTE — Progress Notes (Signed)
Pt.'d d/c papers gone over with her. Prescriptions given to pt. No questions/complaints. IV taken out. Pt. D/c'd successfully.

## 2017-07-20 IMAGING — CT CT ABD-PELV W/ CM
2 of 6 series · 15 of 46 positions shown, 17 images · IV contrast (APPLIED)
Comparison: [DATE]

CLINICAL DATA: Abdominal pain and constipation for 2 days

EXAM:
CT ABDOMEN AND PELVIS WITH CONTRAST
TECHNIQUE: Multidetector CT imaging of the abdomen and pelvis was performed
using the standard protocol following bolus administration of
intravenous contrast.
CONTRAST:  100mL EPSCEY-3LL IOPAMIDOL (EPSCEY-3LL) INJECTION 61%

[Series 2: abd/ pelvis 5.0 i30f 1 · axial · 0.93mm/px · z∈[-430,-35]mm · 12 of 91 slices shown, 14 images]
[im 6/91  soft-tissue]
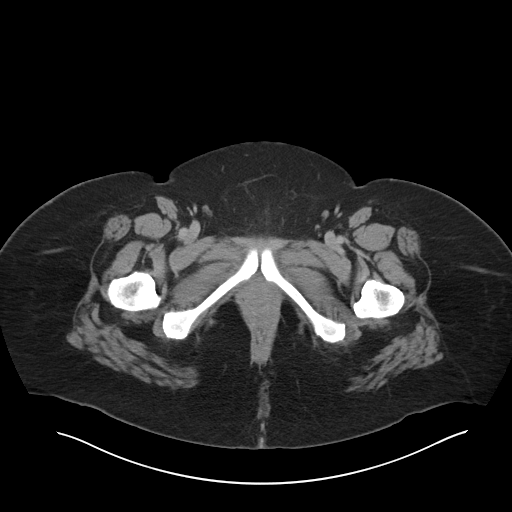
[im 6/91  bone]
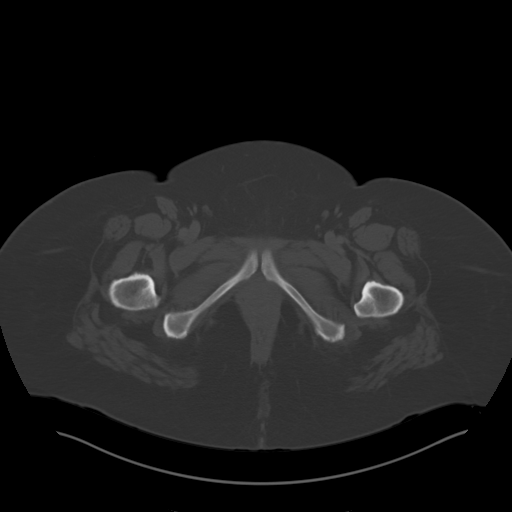
[im 12/91  soft-tissue]
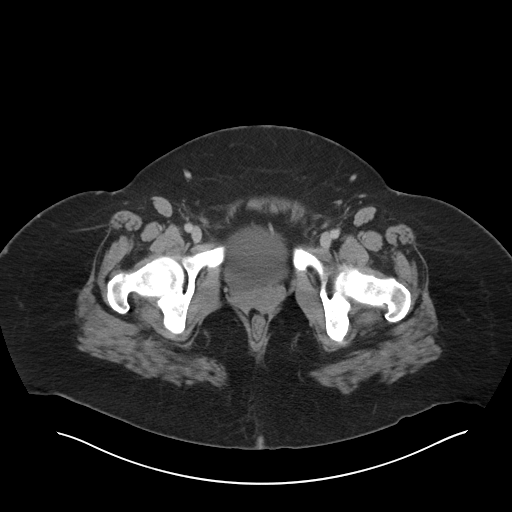
[im 23/91  soft-tissue]
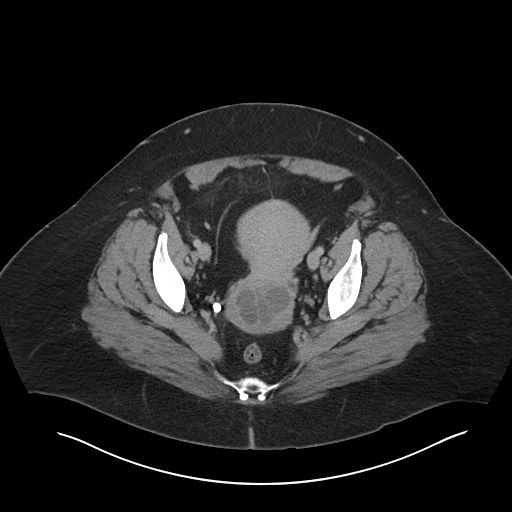
[im 29/91  soft-tissue]
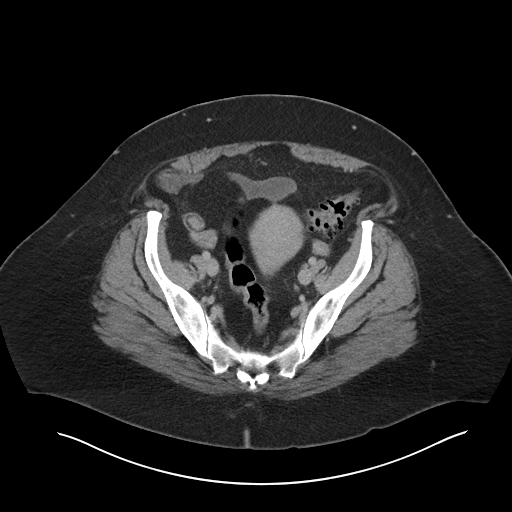
[im 34/91  soft-tissue]
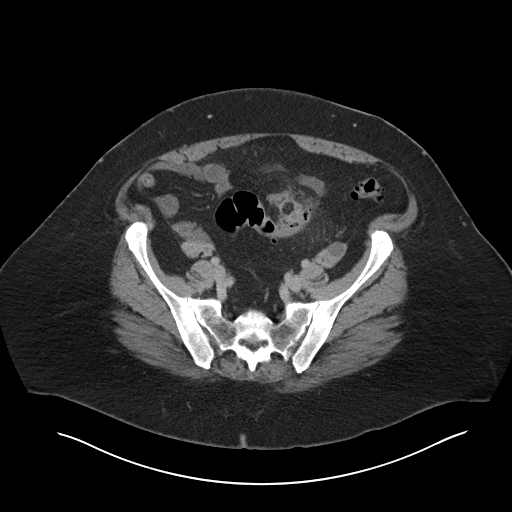
[im 40/91  soft-tissue]
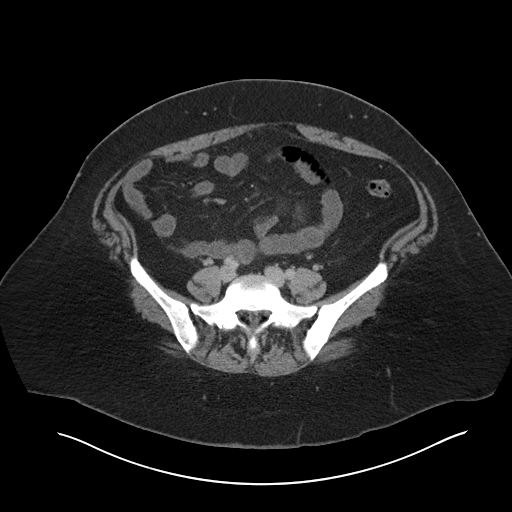
[im 51/91  soft-tissue]
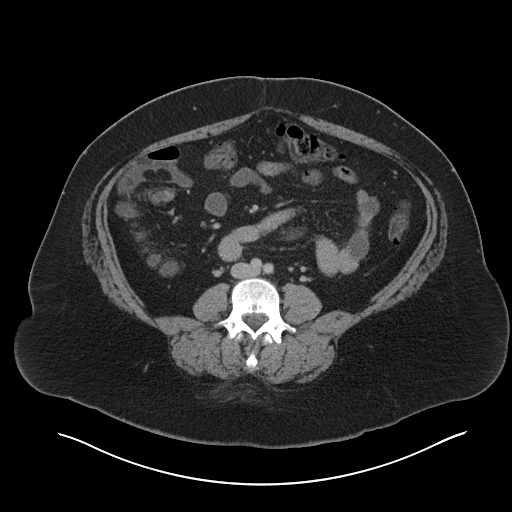
[im 57/91  soft-tissue]
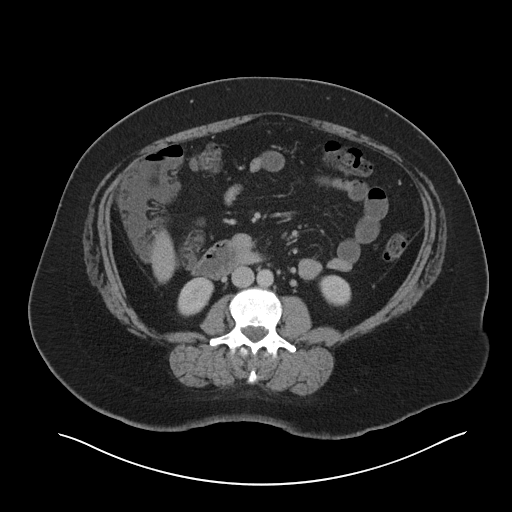
[im 62/91  soft-tissue]
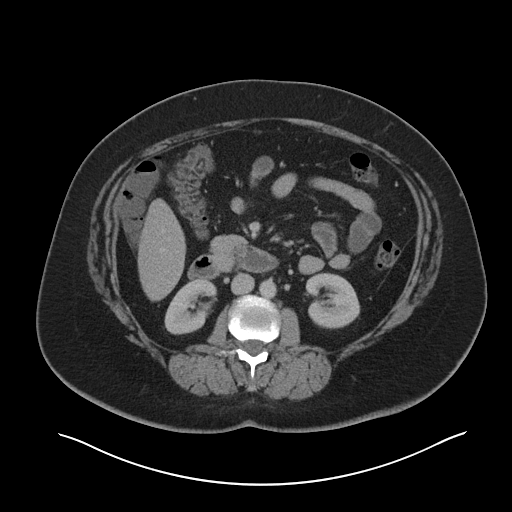
[im 62/91  bone]
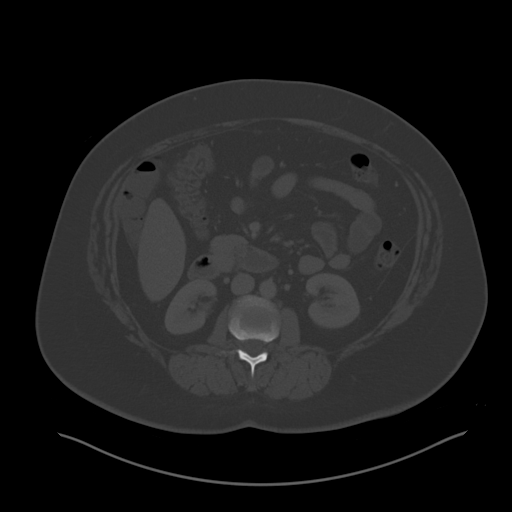
[im 68/91  soft-tissue]
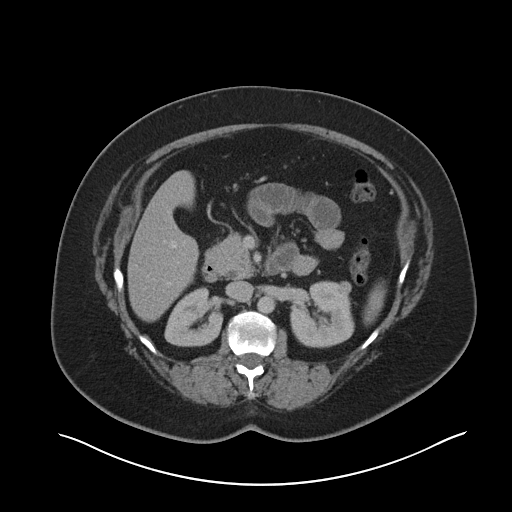
[im 79/91  soft-tissue]
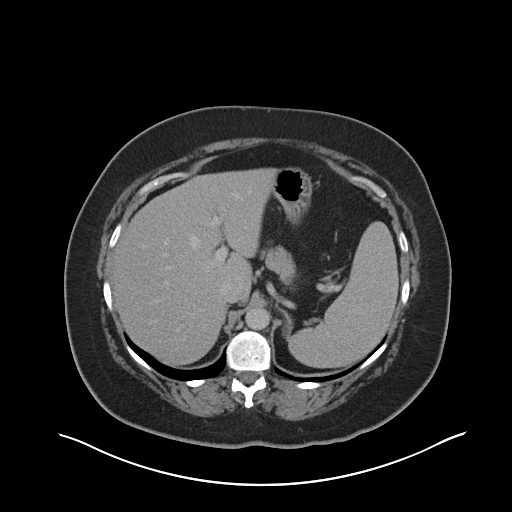
[im 85/91  soft-tissue]
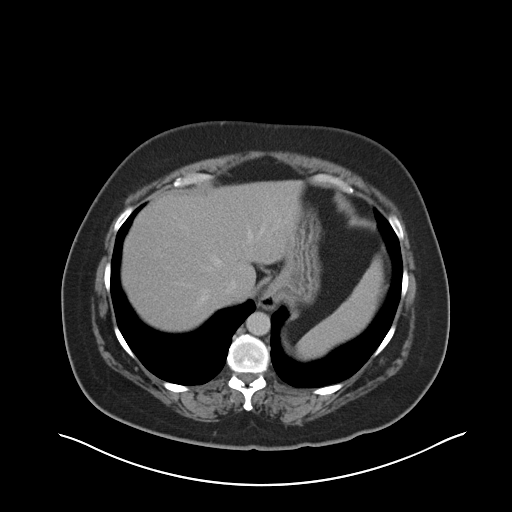

[Series 5: coronal soft tissue · coronal · 0.91mm/px · 3 of 102 slices shown]
[im 34/102  soft-tissue]
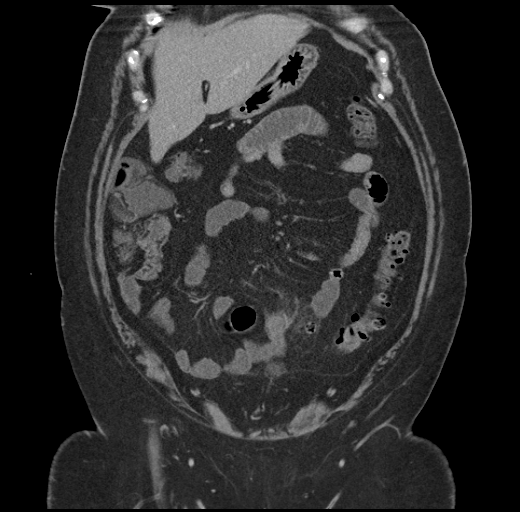
[im 45/102  soft-tissue]
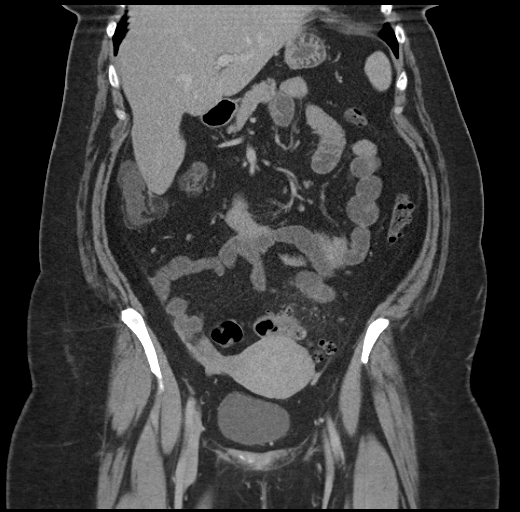
[im 57/102  soft-tissue]
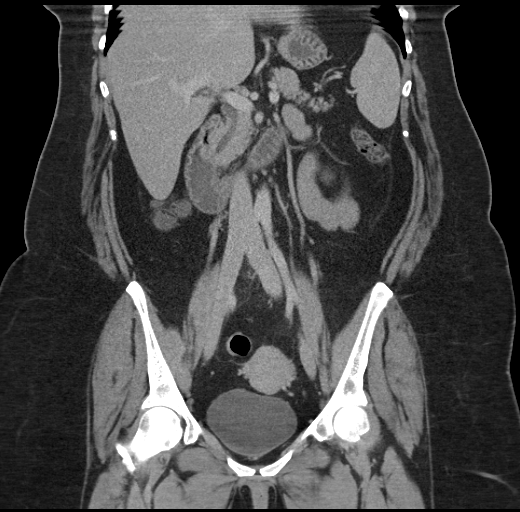

[15 of 46 positions shown; findings below may reference images not displayed]

FINDINGS: Lower chest: Lung bases are unremarkable.  Small hiatal hernia.

Hepatobiliary: No focal liver abnormality is seen. Status post
cholecystectomy. No biliary dilatation.

Pancreas: Unremarkable. No pancreatic ductal dilatation or
surrounding inflammatory changes.

Spleen: The spleen measures 14.5 cm in length. No focal splenic
abnormality.

Adrenals/Urinary Tract: No adrenal gland mass. Kidneys are
symmetrical in size. Bilateral small renal cysts. No hydronephrosis
or hydroureter. Bilateral renal there is symmetrical excretion on
delayed images. Bilateral visualized proximal ureter is
unremarkable.

Stomach/Bowel: No gastric outlet obstruction. Mild fluid distended
small bowel loops in mid lower abdomen probable mild ileus. No
evidence of transition point in caliber of small bowel. No small
bowel obstruction. Terminal ileum is unremarkable.

There is no pericecal inflammation.  The appendix is not identified.

Mild redundant transverse colon. Multiple diverticula are noted
descending colon. Multiple diverticula are noted proximal sigmoid
colon. Axial image 58 there is mild thickening of diverticular wall
and stranding of pericolonic fat in proximal sigmoid colon. Findings
are consistent with acute diverticulitis. A tiny diverticular
abscess measures about 8 mm. Best seen in coronal image 38. There is
no evidence of perforation. No extraluminal air is noted.

Vascular/Lymphatic: No aortic aneurysm. No retroperitoneal or
mesenteric adenopathy.

Reproductive: The uterus is anteflexed. There is circumferential
prominence of uterine cervix and multiple cervical cysts are noted
the largest measures 2.6 cm. Follow-up GYN exam and pelvic
ultrasound is recommended to assure stability. No adnexal mass.
Bilateral ovary is unremarkable. Question bicornuate uterus.

Other: No abdominal ascites.

Musculoskeletal: No destructive bony lesions are noted. Mild
degenerative changes lumbar spine.
IMPRESSION: 1. Multiple diverticula are noted proximal sigmoid colon. Axial
image 58 there is mild thickening of diverticular wall and stranding
of pericolonic fat in proximal sigmoid colon. Findings are
consistent with acute diverticulitis. A tiny diverticular abscess
measures about 8 mm. Best seen in coronal image 38. There is no
evidence of perforation. No extraluminal air is noted.
2. There is circumferential prominence of uterine cervix and
multiple cervical cysts are noted the largest measures 2.6 cm.
Follow-up GYN exam and pelvic ultrasound is recommended to assure
stability. No adnexal mass. Bilateral ovary is unremarkable.
Question bicornuate uterus.
3. Mild fluid distended small bowel loops probable mild ileus. No
transition point in caliber of small bowel.
4. No pericecal inflammation.  The appendix is not identified.
5. Mild degenerative changes lumbar spine.

These results were called by telephone at the time of interpretation
on 11/06/2015 at [DATE] to Dr. KOBE BREEZY , who verbally
acknowledged these results.

## 2020-04-16 DIAGNOSIS — H6502 Acute serous otitis media, left ear: Secondary | ICD-10-CM | POA: Insufficient documentation

## 2020-04-16 DIAGNOSIS — H9072 Mixed conductive and sensorineural hearing loss, unilateral, left ear, with unrestricted hearing on the contralateral side: Secondary | ICD-10-CM | POA: Insufficient documentation

## 2020-04-16 DIAGNOSIS — H6992 Unspecified Eustachian tube disorder, left ear: Secondary | ICD-10-CM | POA: Insufficient documentation

## 2022-03-06 ENCOUNTER — Ambulatory Visit: Payer: BC Managed Care – PPO | Admitting: Podiatry

## 2022-03-06 ENCOUNTER — Other Ambulatory Visit: Payer: Self-pay | Admitting: Podiatry

## 2022-03-06 ENCOUNTER — Ambulatory Visit (INDEPENDENT_AMBULATORY_CARE_PROVIDER_SITE_OTHER): Payer: BC Managed Care – PPO

## 2022-03-06 DIAGNOSIS — M79671 Pain in right foot: Secondary | ICD-10-CM

## 2022-03-06 DIAGNOSIS — N920 Excessive and frequent menstruation with regular cycle: Secondary | ICD-10-CM | POA: Insufficient documentation

## 2022-03-06 DIAGNOSIS — N926 Irregular menstruation, unspecified: Secondary | ICD-10-CM | POA: Insufficient documentation

## 2022-03-06 MED ORDER — MELOXICAM 15 MG PO TABS: 15.0000 mg | ORAL_TABLET | Freq: Every day | ORAL | 0 refills | Status: AC

## 2022-03-06 NOTE — Patient Instructions (Signed)

## 2022-03-06 NOTE — Progress Notes (Signed)
  Subjective:  Patient ID: Tamara Chandler, female    DOB: 1966/01/01,  MRN: [id number]  Chief Complaint  Patient presents with   Foot Pain    Right heel pain for about 2 weeks pt stated that it is not getting any better, no injuries    57 y.o. female presents with the above complaint.  Patient presents with complaint of pain in the right heel.  Has been going on for about 2 weeks.  Denies any injuries has not been getting better denies any recent changes in activity level.  Has tried different shoes and bought some inserts for plantar fascia this at the shoe store.   Review of Systems: Negative except as noted in the HPI. Denies N/V/F/Ch.   Objective:  There were no vitals filed for this visit. There is no height or weight on file to calculate BMI. Constitutional Well developed. Well nourished.  Vascular Dorsalis pedis pulses palpable bilaterally. Posterior tibial pulses palpable bilaterally. Capillary refill normal to all digits.  No cyanosis or clubbing noted. Pedal hair growth normal.  Neurologic Normal speech. Oriented to person, place, and time. Epicritic sensation to light touch grossly present bilaterally.  Dermatologic Nails well groomed and normal in appearance. No open wounds. No skin lesions.  Orthopedic: Normal joint ROM without pain or crepitus bilaterally. No visible deformities. Tender to palpation at the calcaneal tuber right. No pain with calcaneal squeeze right. Ankle ROM diminished range of motion right. Silfverskiold Test: Deferred.   Radiographs: Taken and reviewed. No acute fractures or dislocations. No evidence of stress fracture.  Plantar heel spur  .  Small spur present . Posterior heel spur absent.   Assessment:   1. Pain of right heel    Plan:  Patient was evaluated and treated and all questions answered.  Plantar Fasciitis, right - XR reviewed as above very small spur but no obvious evidence of fracture - Educated on icing and  stretching. Instructions given.  - Injection delivered to the plantar fascia as below. - DME:  discussed power step orthotics however patient wants to hold off on this due to cost related reasons of this time. - Pharmacologic management: Meloxicam 15 mg take once daily at night for the next 30 days for pain and inflammation of the right heel. Educated on risks/benefits and proper taking of medication.  Procedure: Injection Tendon/Ligament Location: Right plantar fascia at the glabrous junction; medial approach. Skin Prep: alcohol Injectate: 1 cc 0.5% marcaine plain, 1 cc kenalog 10. Disposition: Patient tolerated procedure well. Injection site dressed with a band-aid.  Return if symptoms worsen or fail to improve.

## 2023-06-27 ENCOUNTER — Emergency Department (HOSPITAL_COMMUNITY)
Admission: EM | Admit: 2023-06-27 | Discharge: 2023-06-27 | Disposition: A | Discharge status: Home / Self Care | Attending: Emergency Medicine | Admitting: Emergency Medicine

## 2023-06-27 ENCOUNTER — Other Ambulatory Visit: Payer: Self-pay

## 2023-06-27 ENCOUNTER — Emergency Department (HOSPITAL_COMMUNITY)

## 2023-06-27 ENCOUNTER — Encounter (HOSPITAL_COMMUNITY): Payer: Self-pay

## 2023-06-27 DIAGNOSIS — E7800 Pure hypercholesterolemia, unspecified: Secondary | ICD-10-CM | POA: Insufficient documentation

## 2023-06-27 DIAGNOSIS — E860 Dehydration: Secondary | ICD-10-CM | POA: Insufficient documentation

## 2023-06-27 DIAGNOSIS — R000 Tachycardia, unspecified: Secondary | ICD-10-CM | POA: Diagnosis not present

## 2023-06-27 DIAGNOSIS — Z7984 Long term (current) use of oral hypoglycemic drugs: Secondary | ICD-10-CM | POA: Insufficient documentation

## 2023-06-27 DIAGNOSIS — E1165 Type 2 diabetes mellitus with hyperglycemia: Secondary | ICD-10-CM | POA: Diagnosis not present

## 2023-06-27 DIAGNOSIS — E119 Type 2 diabetes mellitus without complications: Secondary | ICD-10-CM

## 2023-06-27 DIAGNOSIS — R739 Hyperglycemia, unspecified: Secondary | ICD-10-CM | POA: Diagnosis present

## 2023-06-27 DIAGNOSIS — E781 Pure hyperglyceridemia: Secondary | ICD-10-CM | POA: Insufficient documentation

## 2023-06-27 LAB — CBC
HCT: 47.4 % — ABNORMAL HIGH (ref 36.0–46.0)
Hemoglobin: 15.6 g/dL — ABNORMAL HIGH (ref 12.0–15.0)
MCH: 29.8 pg (ref 26.0–34.0)
MCHC: 32.9 g/dL (ref 30.0–36.0)
MCV: 90.5 fL (ref 80.0–100.0)
Platelets: 226 10*3/uL (ref 150–400)
RBC: 5.24 MIL/uL — ABNORMAL HIGH (ref 3.87–5.11)
RDW: 12.7 % (ref 11.5–15.5)
WBC: 8.6 10*3/uL (ref 4.0–10.5)
nRBC: 0 % (ref 0.0–0.2)

## 2023-06-27 LAB — I-STAT CHEM 8, ED
BUN: 10 mg/dL (ref 6–20)
Calcium, Ion: 1.04 mmol/L — ABNORMAL LOW (ref 1.15–1.40)
Chloride: 98 mmol/L (ref 98–111)
Creatinine, Ser: 0.7 mg/dL (ref 0.44–1.00)
Glucose, Bld: 380 mg/dL — ABNORMAL HIGH (ref 70–99)
HCT: 47 % — ABNORMAL HIGH (ref 36.0–46.0)
Hemoglobin: 16 g/dL — ABNORMAL HIGH (ref 12.0–15.0)
Potassium: 3.9 mmol/L (ref 3.5–5.1)
Sodium: 133 mmol/L — ABNORMAL LOW (ref 135–145)
TCO2: 24 mmol/L (ref 22–32)

## 2023-06-27 LAB — COMPREHENSIVE METABOLIC PANEL WITH GFR
ALT: 26 U/L (ref 0–44)
AST: 22 U/L (ref 15–41)
Albumin: 3.6 g/dL (ref 3.5–5.0)
Alkaline Phosphatase: 123 U/L (ref 38–126)
Anion gap: 12 (ref 5–15)
BUN: 11 mg/dL (ref 6–20)
CO2: 22 mmol/L (ref 22–32)
Calcium: 8.6 mg/dL — ABNORMAL LOW (ref 8.9–10.3)
Chloride: 97 mmol/L — ABNORMAL LOW (ref 98–111)
Creatinine, Ser: 0.84 mg/dL (ref 0.44–1.00)
GFR, Estimated: >60 mL/min (ref >60)
Glucose, Bld: 395 mg/dL — ABNORMAL HIGH (ref 70–99)
Potassium: 3.2 mmol/L — ABNORMAL LOW (ref 3.5–5.1)
Sodium: 131 mmol/L — ABNORMAL LOW (ref 135–145)
Total Bilirubin: 1.1 mg/dL (ref 0.0–1.2)
Total Protein: 6.9 g/dL (ref 6.5–8.1)

## 2023-06-27 LAB — BLOOD GAS, VENOUS
Acid-Base Excess: 0 mmol/L (ref 0.0–2.0)
Bicarbonate: 25.4 mmol/L (ref 20.0–28.0)
O2 Saturation: 79.9 %
Patient temperature: 37
pCO2, Ven: 43 mmHg — ABNORMAL LOW (ref 44–60)
pH, Ven: 7.38 (ref 7.25–7.43)
pO2, Ven: 46 mmHg — ABNORMAL HIGH (ref 32–45)

## 2023-06-27 LAB — URINALYSIS, ROUTINE W REFLEX MICROSCOPIC
Bacteria, UA: NONE SEEN
Bilirubin Urine: NEGATIVE
Glucose, UA: >=500 mg/dL — AB
Hgb urine dipstick: NEGATIVE
Ketones, ur: 20 mg/dL — AB
Leukocytes,Ua: NEGATIVE
Nitrite: NEGATIVE
Protein, ur: NEGATIVE mg/dL
Specific Gravity, Urine: 1.031 — ABNORMAL HIGH (ref 1.005–1.030)
pH: 5 (ref 5.0–8.0)

## 2023-06-27 LAB — LIPID PANEL
Cholesterol: 255 mg/dL — ABNORMAL HIGH (ref 0–200)
HDL: 30 mg/dL — ABNORMAL LOW (ref >40)
LDL Cholesterol: 164 mg/dL — ABNORMAL HIGH (ref 0–99)
Total CHOL/HDL Ratio: 8.5 ratio
Triglycerides: 307 mg/dL — ABNORMAL HIGH (ref <150)
VLDL: 61 mg/dL — ABNORMAL HIGH (ref 0–40)

## 2023-06-27 LAB — HCG, SERUM, QUALITATIVE: Preg, Serum: NEGATIVE

## 2023-06-27 LAB — CBG MONITORING, ED
Glucose-Capillary: 368 mg/dL — ABNORMAL HIGH (ref 70–99)
Glucose-Capillary: 318 mg/dL — ABNORMAL HIGH (ref 70–99)
Glucose-Capillary: 271 mg/dL — ABNORMAL HIGH (ref 70–99)

## 2023-06-27 LAB — TSH: TSH: 1.964 u[IU]/mL (ref 0.350–4.500)

## 2023-06-27 MED ORDER — SODIUM CHLORIDE 0.9 % IV BOLUS
1000.0000 mL | Freq: Once | INTRAVENOUS | Status: AC
  Administered 2023-06-27: 1000 mL via INTRAVENOUS

## 2023-06-27 MED ORDER — LANCETS MISC. MISC: 1.0000 | Freq: Three times a day (TID) | 0 refills | Status: AC

## 2023-06-27 MED ORDER — METFORMIN HCL 500 MG PO TABS: 500.0000 mg | ORAL_TABLET | Freq: Two times a day (BID) | ORAL | 0 refills | Status: AC

## 2023-06-27 MED ORDER — BLOOD GLUCOSE TEST VI STRP: 1.0000 | ORAL_STRIP | Freq: Three times a day (TID) | 0 refills | Status: AC

## 2023-06-27 MED ORDER — BLOOD GLUCOSE MONITORING SUPPL DEVI
1.0000 | Freq: Three times a day (TID) | 0 refills | Status: AC
Start: 2023-06-27 — End: ?

## 2023-06-27 MED ORDER — LANCET DEVICE MISC: 1.0000 | Freq: Three times a day (TID) | 0 refills | Status: AC

## 2023-06-27 MED ORDER — INSULIN ASPART 100 UNIT/ML IJ SOLN
6.0000 [IU] | Freq: Once | INTRAMUSCULAR | Status: AC
  Administered 2023-06-27: 6 [IU] via INTRAVENOUS
  Filled 2023-06-27: qty 0.06

## 2023-06-27 MED ORDER — ATORVASTATIN CALCIUM 10 MG PO TABS: 10.0000 mg | ORAL_TABLET | Freq: Every day | ORAL | 0 refills | Status: AC

## 2023-06-27 NOTE — ED Provider Notes (Signed)
  EMERGENCY DEPARTMENT AT [facility] Provider Note   CSN: [id number] Arrival date & time: 06/27/23  1104     History  Chief Complaint  Patient presents with   Hyperglycemia    Tamara Chandler is a 58 y.o. female.  Pt is a 58 yo female with no significant pmhx.  Pt has been having urinary frequency for several weeks.  She went to UC yesterday and BS was over 400.  They told her to come to the ED, but she went home.  She took 1 of her dad's metformin (?dose) pills last night and an otc natural supplement called Berberine.  BS still elevated today.  She also woke up on 5/24 with left sided numbness.  She took an ASA and it gradually went away.  She denies cp or sob.  She does have blurry vision to both eyes, but thought that was due to age.  She is very nervous about medication side effects.  She does not have a PCP and has not had a well visit in years.       Home Medications Prior to Admission medications   Medication Sig Start Date End Date Taking? Authorizing Provider  atorvastatin (LIPITOR) 10 MG tablet Take 1 tablet (10 mg total) by mouth daily. 06/27/23  Yes Sueellen Emery, MD  Blood Glucose Monitoring Suppl DEVI 1 each by Does not apply route in the morning, at noon, and at bedtime. May substitute to any manufacturer covered by patient's insurance. 06/27/23  Yes Sueellen Emery, MD  Glucose Blood (BLOOD GLUCOSE TEST STRIPS) STRP 1 each by In Vitro route in the morning, at noon, and at bedtime. May substitute to any manufacturer covered by patient's insurance. 06/27/23 07/30/23 Yes Sueellen Emery, MD  Lancet Device MISC 1 each by Does not apply route in the morning, at noon, and at bedtime. May substitute to any manufacturer covered by patient's insurance. 06/27/23 07/27/23 Yes Sueellen Emery, MD  Lancets Misc. MISC 1 each by Does not apply route in the morning, at noon, and at bedtime. May substitute to any manufacturer covered by patient's insurance.  06/27/23 07/27/23 Yes Sueellen Emery, MD  metFORMIN (GLUCOPHAGE) 500 MG tablet Take 1 tablet (500 mg total) by mouth 2 (two) times daily with a meal. 06/27/23  Yes Sueellen Emery, MD  acetaminophen  (TYLENOL ) 325 MG tablet Take 2 tablets (650 mg total) by mouth every 6 (six) hours as needed for mild pain (or temp > 100). 11/09/15   Monetta Angst, PA-C  azelastine (ASTELIN) 0.1 % nasal spray SPRAY 2 TIMES IN EACH NOSTRIL TWICE DAILY 03/24/20   [provider]  ciprofloxacin  (CIPRO ) 500 MG tablet Take 1 tablet (500 mg total) by mouth 2 (two) times daily. 11/09/15   Monetta Angst, PA-C  fluticasone (FLONASE) 50 MCG/ACT nasal spray Instill 2 puffs each nostril every night. 04/16/20   [provider]  meloxicam  (MOBIC ) 15 MG tablet Take 1 tablet (15 mg total) by mouth daily. 03/06/22   Standiford, Karlene Overcast, DPM  metroNIDAZOLE  (FLAGYL ) 500 MG tablet Take 1 tablet (500 mg total) by mouth every 8 (eight) hours. 11/09/15   Monetta Angst, PA-C  ondansetron  (ZOFRAN -ODT) 4 MG disintegrating tablet Take 1 tablet (4 mg total) by mouth every 6 (six) hours as needed for nausea. 11/09/15   Monetta Angst, PA-C  oxyCODONE -acetaminophen  (PERCOCET/ROXICET) 5-325 MG tablet Take 1-2 tablets by mouth every 4 (four) hours as needed for moderate pain. 11/09/15   Monetta Angst, PA-C  saccharomyces boulardii [facility])  250 MG capsule YOU CAN BUY THIS OVER THE COUNTER AND TAKE AS DIRECTED ON PACKAGE FOR AT LEAST A WEEK AFTER YOU FINISH ANTIBIOTICS. 11/09/15   Monetta Angst, PA-C      Allergies    Patient has no known allergies.    Review of Systems   Review of Systems  Endocrine: Positive for polyuria.  All other systems reviewed and are negative.   Physical Exam Updated Vital Signs BP 116/75   Pulse 82   Temp 98 F (36.7 C) (Oral)   Resp 11   Ht 5' 9 (1.753 m)   Wt 123.4 kg   SpO2 99%   BMI 40.17 kg/m  Physical Exam Vitals and nursing note reviewed.  Constitutional:       Appearance: Normal appearance. She is obese.  HENT:     Head: Normocephalic and atraumatic.     Right Ear: External ear normal.     Left Ear: External ear normal.     Nose: Nose normal.     Mouth/Throat:     Mouth: Mucous membranes are dry.  Eyes:     Extraocular Movements: Extraocular movements intact.     Conjunctiva/sclera: Conjunctivae normal.     Pupils: Pupils are equal, round, and reactive to light.  Cardiovascular:     Rate and Rhythm: Regular rhythm. Tachycardia present.     Pulses: Normal pulses.     Heart sounds: Normal heart sounds.  Pulmonary:     Effort: Pulmonary effort is normal.     Breath sounds: Normal breath sounds.  Abdominal:     General: Abdomen is flat. Bowel sounds are normal.     Palpations: Abdomen is soft.  Musculoskeletal:        General: Normal range of motion.     Cervical back: Normal range of motion and neck supple.  Skin:    General: Skin is warm.     Capillary Refill: Capillary refill takes less than 2 seconds.  Neurological:     General: No focal deficit present.     Mental Status: She is alert and oriented to person, place, and time.  Psychiatric:        Mood and Affect: Mood normal.        Behavior: Behavior normal.     ED Results / Procedures / Treatments   Labs (all labs ordered are listed, but only abnormal results are displayed) Labs Reviewed  CBC - Abnormal; Notable for the following components:      Result Value   RBC 5.24 (*)    Hemoglobin 15.6 (*)    HCT 47.4 (*)    All other components within normal limits  URINALYSIS, ROUTINE W REFLEX MICROSCOPIC - Abnormal; Notable for the following components:   Specific Gravity, Urine 1.031 (*)    Glucose, UA >=500 (*)    Ketones, ur 20 (*)    All other components within normal limits  COMPREHENSIVE METABOLIC PANEL WITH GFR - Abnormal; Notable for the following components:   Sodium 131 (*)    Potassium 3.2 (*)    Chloride 97 (*)    Glucose, Bld 395 (*)    Calcium 8.6 (*)     All other components within normal limits  BLOOD GAS, VENOUS - Abnormal; Notable for the following components:   pCO2, Ven 43 (*)    pO2, Ven 46 (*)    All other components within normal limits  LIPID PANEL - Abnormal; Notable for the following components:   Cholesterol 255 (*)  Triglycerides 307 (*)    HDL 30 (*)    VLDL 61 (*)    LDL Cholesterol 164 (*)    All other components within normal limits  CBG MONITORING, ED - Abnormal; Notable for the following components:   Glucose-Capillary 368 (*)    All other components within normal limits  CBG MONITORING, ED - Abnormal; Notable for the following components:   Glucose-Capillary 318 (*)    All other components within normal limits  I-STAT CHEM 8, ED - Abnormal; Notable for the following components:   Sodium 133 (*)    Glucose, Bld 380 (*)    Calcium, Ion 1.04 (*)    Hemoglobin 16.0 (*)    HCT 47.0 (*)    All other components within normal limits  CBG MONITORING, ED - Abnormal; Notable for the following components:   Glucose-Capillary 271 (*)    All other components within normal limits  HCG, SERUM, QUALITATIVE  TSH  HEMOGLOBIN A1C  CBG MONITORING, ED  CBG MONITORING, ED    EKG EKG Interpretation Date/Time:  Wednesday June 27 2023 11:27:31 EDT Ventricular Rate:  97 PR Interval:  124 QRS Duration:  98 QT Interval:  374 QTC Calculation: 476 R Axis:   -46  Text Interpretation: Sinus rhythm LAD, consider left anterior fascicular block Low voltage, precordial leads Consider anterior infarct No significant change since last tracing Confirmed by Sueellen Emery [phone]) on 06/27/2023 11:30:14 AM  Radiology CT Head Wo Contrast Result Date: 06/27/2023 CLINICAL DATA:  Neuro deficit, acute, stroke suspected EXAM: CT HEAD WITHOUT CONTRAST TECHNIQUE: Contiguous axial images were obtained from the base of the skull through the vertex without intravenous contrast. RADIATION DOSE REDUCTION: This exam was performed according to the  departmental dose-optimization program which includes automated exposure control, adjustment of the mA and/or kV according to patient size and/or use of iterative reconstruction technique. COMPARISON:  None Available. FINDINGS: Brain: Normal. No evidence of hemorrhage, mass, cortical infarct or hydrocephalus. Vascular: Calcific atheromatous disease within the carotid siphons. Skull: Intact. Sinuses/Orbits: Near complete opacification of the right sphenoid sinus and right posterior ethmoid air cells. Orbits are unremarkable. Other: None. IMPRESSION: 1. Normal brain. 2. Moderate right sphenoid sinus disease. Electronically Signed   By: Maribeth Shivers M.D.   On: 06/27/2023 12:02   DG Chest Portable 1 View Result Date: 06/27/2023 CLINICAL DATA:  Weakness. EXAM: PORTABLE CHEST 1 VIEW COMPARISON:  Chest radiograph dated 10/22/2003 FINDINGS: The heart size and mediastinal contours are within normal limits. Both lungs are clear. The visualized skeletal structures are unremarkable. IMPRESSION: No active disease. Electronically Signed   By: Angus Bark M.D.   On: 06/27/2023 12:02    Procedures Procedures    Medications Ordered in ED Medications  sodium chloride  0.9 % bolus 1,000 mL (0 mLs Intravenous Stopped 06/27/23 1330)  sodium chloride  0.9 % bolus 1,000 mL (0 mLs Intravenous Stopped 06/27/23 1506)  insulin aspart (novoLOG) injection 6 Units (6 Units Intravenous Given 06/27/23 1352)    ED Course/ Medical Decision Making/ A&P                                 Medical Decision Making Amount and/or Complexity of Data Reviewed Labs: ordered. Radiology: ordered.  Risk OTC drugs. Prescription drug management.   This patient presents to the ED for concern of hyperglycemia, this involves an extensive number of treatment options, and is a complaint that carries with it a high  risk of complications and morbidity.  The differential diagnosis includes hhs, dka, infection, cva, cardiac event,  electrolyte abn   Co morbidities that complicate the patient evaluation  none   Additional history obtained:  Additional history obtained from epic chart review  Lab Tests:  I Ordered, and personally interpreted labs.  The pertinent results include:  vbg with pH 7.38, pCO2 43; lipid panel with elevated TG and total cholesterol; hdl low at 30 and ldl elevated at 164; cbc nl; preg neg; ua + glucose, ketones   Imaging Studies ordered:  I ordered imaging studies including cxr, ct head  I independently visualized and interpreted imaging which showed  CXR: No active disease.  CT head: 1. Normal brain.  2. Moderate right sphenoid sinus disease.   I agree with the radiologist interpretation   Cardiac Monitoring:  The patient was maintained on a cardiac monitor.  I personally viewed and interpreted the cardiac monitored which showed an underlying rhythm of: st   Medicines ordered and prescription drug management:  I ordered medication including ivfs  for sx  Reevaluation of the patient after these medicines showed that the patient improved I have reviewed the patients home medicines and have made adjustments as needed   Test Considered:  ct   Critical Interventions:  ivfs   Problem List / ED Course:  Hyperglycemia:  bs is down to 271 after 2L fluid and 6 units insulin.  She does agree for a rx for metformin.  She is encouraged to eat a diabetic diet.  She needs to establish care with a pcp. Hypertriglyceridemia and high cholesterol:pt is to be started on lipitor.  She is to return if worse.  F/u with pcp. Blurry vision:  likely partially due to hyperglycemia.  20/25 both eyes.  Pt to f/u with ophthalmology.   Reevaluation:  After the interventions noted above, I reevaluated the patient and found that they have :improved   Social Determinants of Health:  Lives at home; no pcp   Dispostion:  After consideration of the diagnostic results and the patients  response to treatment, I feel that the patent would benefit from discharge with outpatient follow up.          Final Clinical Impression(s) / ED Diagnoses Final diagnoses:  New onset type 2 diabetes mellitus (HCC)  High cholesterol  Hypertriglyceridemia  Dehydration    Rx / DC Orders ED Discharge Orders          Ordered    Blood Glucose Monitoring Suppl DEVI  3 times daily        06/27/23 1330    Glucose Blood (BLOOD GLUCOSE TEST STRIPS) STRP  3 times daily        06/27/23 1330    Lancet Device MISC  3 times daily        06/27/23 1330    Lancets Misc. MISC  3 times daily        06/27/23 1330    metFORMIN (GLUCOPHAGE) 500 MG tablet  2 times daily with meals        06/27/23 1445    atorvastatin (LIPITOR) 10 MG tablet  Daily        06/27/23 1445              Sueellen Emery, MD 06/27/23 1521

## 2023-06-27 NOTE — ED Triage Notes (Signed)
 Pt states she went to UC yesterday and her BG was elevated over 400mg /dl. Pt states she is not a diabetic. Pt states she has been having increased urine and thirst.

## 2023-06-27 NOTE — ED Notes (Signed)
 Visual acuity: Both eyes=20/25, left eye=20/25, right eye=20/25

## 2023-06-27 NOTE — Discharge Instructions (Addendum)
 It is very important you establish care with a PCP.  You also need to make an appointment with an ophthalmologist.

## 2023-06-28 LAB — HEMOGLOBIN A1C
Hgb A1c MFr Bld: 13.9 % — ABNORMAL HIGH (ref 4.8–5.6)
Mean Plasma Glucose: 352 mg/dL

## 2023-06-29 ENCOUNTER — Ambulatory Visit (HOSPITAL_COMMUNITY): Payer: Self-pay
